# Patient Record
Sex: Female | Born: 1990 | Race: White | Hispanic: No | Marital: Single | Smoking: Never smoker
Health system: Southern US, Community
[De-identification: ages and names within clinical notes are randomized; demographics above are authoritative.]

## PROBLEM LIST (undated history)

## (undated) DIAGNOSIS — S82899A Other fracture of unspecified lower leg, initial encounter for closed fracture: Secondary | ICD-10-CM

## (undated) HISTORY — PX: INGUINAL HERNIA REPAIR: SHX194

## (undated) HISTORY — PX: WRIST SURGERY: SHX841

---
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
## 2009-02-27 ENCOUNTER — Emergency Department (HOSPITAL_COMMUNITY): Admission: EM | Admit: 2009-02-27 | Discharge: 2009-02-27 | Payer: Self-pay | Admitting: Family Medicine

## 2010-03-26 ENCOUNTER — Emergency Department (HOSPITAL_COMMUNITY)
Admission: EM | Admit: 2010-03-26 | Discharge: 2010-03-26 | Disposition: A | Discharge status: Home / Self Care | Payer: Self-pay | Attending: Emergency Medicine | Admitting: Emergency Medicine

## 2010-03-26 DIAGNOSIS — G5600 Carpal tunnel syndrome, unspecified upper limb: Secondary | ICD-10-CM | POA: Insufficient documentation

## 2010-05-20 NOTE — Discharge Summary (Signed)
NAMEBRITTLYN, Caroline Patterson                ACCOUNT NO.:  [id number]   MEDICAL RECORD NO.:  [id number]          PATIENT TYPE:  INP   LOCATION:  9114                          FACILITY:  [facility]   PHYSICIAN:  Malachi Pro. Ambrose Mantle, M.D. DATE OF BIRTH:  [birth year]   DATE OF ADMISSION:  03/28/2006  DATE OF DISCHARGE:  03/30/2006                               DISCHARGE SUMMARY   This is an 20 year old white single female, para 0 gravida 1, EDC April 06, 2006, by 10-week ultrasound, admitted for induction of labor.  Blood  group and type O negative with a negative antibody, nonreactive  serology, rubella immune, hepatitis B surface antigen negative, HIV  negative, GC and chlamydia negative.  One-hour Glucola 103.  Group B  strep negative.  Vaginal ultrasound on September 13, 2005, showed a  crown-rump length of 3.82 cm, 10 weeks 5 days, [facility] April 06, 2006.  Follow-up ultrasound on November 13, 2005:  Average gestational age [redacted]  weeks 0 days, [facility] April 02, 2006.  Prenatal care was uncomplicated.  The  cervix was 2 cm, 60%, vertex at a -2 station.  She was admitted for  induction.   PAST MEDICAL HISTORY:  No known allergies.  Operations:  Bilateral  inguinal hernia repair as a child.  Illnesses:  None.  Alcohol, tobacco  and drugs:  None.   FAMILY HISTORY:  Father with high blood pressure and alcohol problems.  Maternal grandmother with diabetes.  Mother with panic attacks and  anxiety.  Paternal grandmother with thrombophlebitis and paternal  grandfather with heart disease.   On admission her vital signs were normal.  Heart and lungs were normal.  The abdomen was soft.  Uterus was term size.  Fetal heart tones were  normal.  The cervix was 2 cm, 60%, vertex at a -2.   ADMITTING IMPRESSIONS:  Intrauterine pregnancy at 39 weeks.   The patient was placed on Pitocin.  By 1:41 p.m. the Pitocin was at 16  milliunits a minute.  Contractions every 2 minutes, somewhat painful.  The cervix was 3 cm, 70%, vertex  at a -2.  At 5:49 p.m. the Pitocin was  at 20 milliunits a minute.  Contractions were somewhat irregular.  Cervix was 3 cm, 70%, vertex at a -2, and artificial rupture of the  membranes produced clear fluid.  The nurse called the cervix 3-4 cm at  approximately 7:30 p.m.  She received an epidural.  At 8:30 p.m. the  cervix was 4-5 cm and at 9:32 p.m. was 5 cm, 100%, vertex at a -1.  Pitocin was at 22 milliunits a minute.  Contractions were every 2  minutes.  By 11:02 p.m. the Pitocin was at 28 milliunits a minute.  The  cervix was 8 cm, 100%, vertex at a 0 to 01 station.  Contractions every  2 minutes.  At 12:02 a.m. the cervix was fully dilated, vertex at a 0 to  +1, one station.  At 1:38 a.m. the cervix was 10 cm, vertex was close to  the perineum but not visible without perineal pressure, vertex felt OA.  After 3 hours of pushing, the patient was given 10 minutes of pushing,  then a left mediolateral episiotomy was made under local block and a  living female infant 8 pounds 2 ounces with Apgars of 8 at one and 9 at  five minutes was delivered OA.  Mild shoulder dystocia was managed by  wood screw and McRoberts maneuvers.  Placenta was intact.  The uterus  was normal.  Rectal was negative.  Left mediolateral episiotomy was  repaired with 2-0 and 3-0 Vicryl.  Blood loss was about 400 mL.  Postpartum the patient did well.  Initially she declined discharge on  the first postpartum day but subsequently recanted her decision and  wanted to be discharged, so she was discharged on the first postpartum  day.   Laboratory data showed initial hemoglobin of 12.7, hematocrit 37.6,  white count 8400, platelet count 161,000.  Follow-up hemoglobin 11.9.  The patient was not a candidate for RhoGAM because the baby was Rh  negative.   FINAL DIAGNOSES:  1. Intrauterine pregnancy at 39 weeks, delivered occiput anterior.  2. Prolonged second stage of labor.   OPERATION:  1. Spontaneous delivery,  occiput anterior.  2. Left mediolateral episiotomy and repair.   FINAL CONDITION:  Improved.   Instructions include our regular discharge instruction booklet.  The  patient is given a prescription for Percocet 5/325 mg 24 tablets, one  every 4-6 hours as needed for pain, and she is advised to return to the  office in 6 weeks for follow-up examination.      Malachi Pro. Ambrose Mantle, M.D.  Electronically Signed     TFH/MEDQ  D:  03/30/2006  T:  03/30/2006  Job:  [id number]

## 2012-04-03 ENCOUNTER — Ambulatory Visit: Payer: Self-pay | Admitting: Family Medicine

## 2013-11-05 ENCOUNTER — Other Ambulatory Visit: Payer: Self-pay | Admitting: Obstetrics and Gynecology

## 2013-11-05 DIAGNOSIS — N63 Unspecified lump in unspecified breast: Secondary | ICD-10-CM

## 2013-11-06 ENCOUNTER — Encounter (INDEPENDENT_AMBULATORY_CARE_PROVIDER_SITE_OTHER): Payer: Self-pay

## 2013-11-06 ENCOUNTER — Ambulatory Visit
Admission: RE | Admit: 2013-11-06 | Discharge: 2013-11-06 | Disposition: A | Discharge status: Home / Self Care | Payer: 59 | Source: Ambulatory Visit | Attending: Obstetrics and Gynecology | Admitting: Obstetrics and Gynecology

## 2013-11-06 DIAGNOSIS — N63 Unspecified lump in unspecified breast: Secondary | ICD-10-CM

## 2015-12-03 DIAGNOSIS — S82899A Other fracture of unspecified lower leg, initial encounter for closed fracture: Secondary | ICD-10-CM

## 2015-12-03 HISTORY — DX: Other fracture of unspecified lower leg, initial encounter for closed fracture: S82.899A

## 2015-12-23 ENCOUNTER — Encounter (HOSPITAL_BASED_OUTPATIENT_CLINIC_OR_DEPARTMENT_OTHER): Payer: Self-pay | Admitting: *Deleted

## 2015-12-28 ENCOUNTER — Ambulatory Visit: Payer: Self-pay | Admitting: Physician Assistant

## 2015-12-28 NOTE — H&P (Signed)
Caroline Patterson is an 25 y.o. female.   Chief Complaint: left ankle pain HPI: She was seen at urgent care.  She fell in the snow.  She has a lot of ankle pain central and medial.  She was told she had a fracture.  She does have an injury of the syndesmotic ligaments clinically, radiographically and on MRI.   Past Medical History:  Diagnosis Date  . Ankle fracture 12/2015   left    Past Surgical History:  Procedure Laterality Date  . INGUINAL HERNIA REPAIR    . WRIST SURGERY     tendon repair    No family history on file. Social History:  reports that she has never smoked. She has never used smokeless tobacco. She reports that she drinks alcohol. She reports that she does not use drugs.  Allergies: No Known Allergies   (Not in a hospital admission)  No results found for this or any previous visit (from the past 48 hour(s)). No results found.  Review of Systems  Musculoskeletal: Positive for falls and joint pain.  All other systems reviewed and are negative.   There were no vitals taken for this visit. Physical Exam  Constitutional: She is oriented to person, place, and time. She appears well-developed and well-nourished. No distress.  HENT:  Head: Normocephalic and atraumatic.  Nose: Nose normal.  Eyes: Conjunctivae and EOM are normal. Pupils are equal, round, and reactive to light.  Neck: Normal range of motion. Neck supple.  Cardiovascular: Normal rate and intact distal pulses.   Respiratory: Effort normal. No respiratory distress.  GI: Soft. She exhibits no distension. There is no tenderness.  Musculoskeletal:       Left ankle: She exhibits swelling and ecchymosis. Tenderness.  Neurological: She is alert and oriented to person, place, and time. No cranial nerve deficit.  Skin: Skin is warm and dry. No rash noted. No erythema.  Psychiatric: She has a normal mood and affect. Her behavior is normal.     Assessment/Plan Left ankle syndesmotic injury   I discussed  with her open reduction internal fixation of the syndesmosis. I also took the liberty of showing this to Dr. Carter Kitten and Dr. Fredonia Highland who both agree with my conclusion that this would require surgery. The risks and benefits were discussed in detail with the patient. She has Hydrocodone now; we will give her Percocet after surgery. Temporary handicap. We will see her back after the surgery.   Chriss Czar, PA-C 12/28/2015, 1:17 PM

## 2015-12-30 ENCOUNTER — Ambulatory Visit (HOSPITAL_BASED_OUTPATIENT_CLINIC_OR_DEPARTMENT_OTHER): Payer: 59 | Admitting: Anesthesiology

## 2015-12-30 ENCOUNTER — Encounter (HOSPITAL_BASED_OUTPATIENT_CLINIC_OR_DEPARTMENT_OTHER)
Admission: RE | Disposition: A | Discharge status: Home / Self Care | Payer: Self-pay | Source: Ambulatory Visit | Attending: Orthopedic Surgery

## 2015-12-30 ENCOUNTER — Encounter (HOSPITAL_BASED_OUTPATIENT_CLINIC_OR_DEPARTMENT_OTHER): Payer: Self-pay | Admitting: Anesthesiology

## 2015-12-30 ENCOUNTER — Ambulatory Visit (HOSPITAL_BASED_OUTPATIENT_CLINIC_OR_DEPARTMENT_OTHER)
Admission: RE | Admit: 2015-12-30 | Discharge: 2015-12-30 | Disposition: A | Discharge status: Home / Self Care | Payer: 59 | Source: Ambulatory Visit | Attending: Orthopedic Surgery | Admitting: Orthopedic Surgery

## 2015-12-30 DIAGNOSIS — W000XXA Fall on same level due to ice and snow, initial encounter: Secondary | ICD-10-CM | POA: Insufficient documentation

## 2015-12-30 DIAGNOSIS — S93432A Sprain of tibiofibular ligament of left ankle, initial encounter: Secondary | ICD-10-CM | POA: Insufficient documentation

## 2015-12-30 HISTORY — DX: Other fracture of unspecified lower leg, initial encounter for closed fracture: S82.899A

## 2015-12-30 HISTORY — PX: ORIF ANKLE FRACTURE: SHX5408

## 2015-12-30 SURGERY — OPEN REDUCTION INTERNAL FIXATION (ORIF) ANKLE FRACTURE
Anesthesia: General | Site: Ankle | Laterality: Left

## 2015-12-30 MED ORDER — MIDAZOLAM HCL 2 MG/2ML IJ SOLN
INTRAMUSCULAR | Status: AC
  Filled 2015-12-30: qty 2

## 2015-12-30 MED ORDER — ONDANSETRON HCL 4 MG/2ML IJ SOLN
INTRAMUSCULAR | Status: AC
  Filled 2015-12-30: qty 2

## 2015-12-30 MED ORDER — PROMETHAZINE HCL 25 MG/ML IJ SOLN: 6.2500 mg | INTRAMUSCULAR | Status: DC | PRN

## 2015-12-30 MED ORDER — MIDAZOLAM HCL 2 MG/2ML IJ SOLN
1.0000 mg | INTRAMUSCULAR | Status: DC | PRN
  Administered 2015-12-30: 2 mg via INTRAVENOUS

## 2015-12-30 MED ORDER — OXYCODONE-ACETAMINOPHEN 5-325 MG PO TABS: 1.0000 | ORAL_TABLET | ORAL | 0 refills | Status: DC | PRN

## 2015-12-30 MED ORDER — FENTANYL CITRATE (PF) 100 MCG/2ML IJ SOLN
INTRAMUSCULAR | Status: AC
  Filled 2015-12-30: qty 2

## 2015-12-30 MED ORDER — BUPIVACAINE-EPINEPHRINE (PF) 0.5% -1:200000 IJ SOLN
INTRAMUSCULAR | Status: DC | PRN
  Administered 2015-12-30: 20 mL via PERINEURAL
  Administered 2015-12-30: 30 mL via PERINEURAL

## 2015-12-30 MED ORDER — PROPOFOL 10 MG/ML IV BOLUS
INTRAVENOUS | Status: DC | PRN
  Administered 2015-12-30: 200 mg via INTRAVENOUS

## 2015-12-30 MED ORDER — FENTANYL CITRATE (PF) 100 MCG/2ML IJ SOLN
50.0000 ug | INTRAMUSCULAR | Status: DC | PRN
  Administered 2015-12-30: 100 ug via INTRAVENOUS

## 2015-12-30 MED ORDER — DEXAMETHASONE SODIUM PHOSPHATE 10 MG/ML IJ SOLN
INTRAMUSCULAR | Status: AC
  Filled 2015-12-30: qty 1

## 2015-12-30 MED ORDER — ONDANSETRON HCL 4 MG/2ML IJ SOLN
INTRAMUSCULAR | Status: DC | PRN
  Administered 2015-12-30: 4 mg via INTRAVENOUS

## 2015-12-30 MED ORDER — FENTANYL CITRATE (PF) 100 MCG/2ML IJ SOLN
INTRAMUSCULAR | Status: DC | PRN
  Administered 2015-12-30: 100 ug via INTRAVENOUS

## 2015-12-30 MED ORDER — LIDOCAINE HCL (CARDIAC) 20 MG/ML IV SOLN
INTRAVENOUS | Status: DC | PRN
  Administered 2015-12-30: 30 mg via INTRAVENOUS

## 2015-12-30 MED ORDER — HYDROMORPHONE HCL 1 MG/ML IJ SOLN: 0.2500 mg | INTRAMUSCULAR | Status: DC | PRN

## 2015-12-30 MED ORDER — MIDAZOLAM HCL 5 MG/5ML IJ SOLN
INTRAMUSCULAR | Status: DC | PRN
  Administered 2015-12-30: 2 mg via INTRAVENOUS

## 2015-12-30 MED ORDER — LACTATED RINGERS IV SOLN: INTRAVENOUS | Status: DC

## 2015-12-30 MED ORDER — LIDOCAINE 2% (20 MG/ML) 5 ML SYRINGE
INTRAMUSCULAR | Status: AC
  Filled 2015-12-30: qty 5

## 2015-12-30 MED ORDER — PROPOFOL 10 MG/ML IV BOLUS
INTRAVENOUS | Status: AC
  Filled 2015-12-30: qty 20

## 2015-12-30 MED ORDER — OXYCODONE HCL 5 MG/5ML PO SOLN: 5.0000 mg | Freq: Once | ORAL | Status: DC | PRN

## 2015-12-30 MED ORDER — SCOPOLAMINE 1 MG/3DAYS TD PT72: 1.0000 | MEDICATED_PATCH | Freq: Once | TRANSDERMAL | Status: DC | PRN

## 2015-12-30 MED ORDER — LACTATED RINGERS IV SOLN
INTRAVENOUS | Status: DC
  Administered 2015-12-30 (×2): via INTRAVENOUS

## 2015-12-30 MED ORDER — CHLORHEXIDINE GLUCONATE 4 % EX LIQD: 60.0000 mL | Freq: Once | CUTANEOUS | Status: DC

## 2015-12-30 MED ORDER — CEFAZOLIN SODIUM-DEXTROSE 2-4 GM/100ML-% IV SOLN
2.0000 g | INTRAVENOUS | Status: AC
  Administered 2015-12-30: 2 g via INTRAVENOUS

## 2015-12-30 MED ORDER — OXYCODONE HCL 5 MG PO TABS: 5.0000 mg | ORAL_TABLET | Freq: Once | ORAL | Status: DC | PRN

## 2015-12-30 MED ORDER — MEPERIDINE HCL 25 MG/ML IJ SOLN: 6.2500 mg | INTRAMUSCULAR | Status: DC | PRN

## 2015-12-30 MED ORDER — CEFAZOLIN SODIUM-DEXTROSE 2-4 GM/100ML-% IV SOLN
INTRAVENOUS | Status: AC
  Filled 2015-12-30: qty 100

## 2015-12-30 MED ORDER — DEXAMETHASONE SODIUM PHOSPHATE 4 MG/ML IJ SOLN
INTRAMUSCULAR | Status: DC | PRN
  Administered 2015-12-30: 10 mg via INTRAVENOUS

## 2015-12-30 SURGICAL SUPPLY — 70 items
BANDAGE ACE 4X5 VEL STRL LF (GAUZE/BANDAGES/DRESSINGS) ×3 IMPLANT
BANDAGE ACE 6X5 VEL STRL LF (GAUZE/BANDAGES/DRESSINGS) ×3 IMPLANT
BANDAGE ESMARK 6X9 LF (GAUZE/BANDAGES/DRESSINGS) ×1 IMPLANT
BLADE SURG 15 STRL LF DISP TIS (BLADE) ×2 IMPLANT
BLADE SURG 15 STRL SS (BLADE) ×4
BNDG ESMARK 4X9 LF (GAUZE/BANDAGES/DRESSINGS) IMPLANT
BNDG ESMARK 6X9 LF (GAUZE/BANDAGES/DRESSINGS) ×3
BNDG GAUZE ELAST 4 BULKY (GAUZE/BANDAGES/DRESSINGS) ×3 IMPLANT
CANISTER SUCT 1200ML W/VALVE (MISCELLANEOUS) IMPLANT
COVER BACK TABLE 60X90IN (DRAPES) ×3 IMPLANT
CUFF TOURNIQUET SINGLE 44IN (TOURNIQUET CUFF) ×3 IMPLANT
DECANTER SPIKE VIAL GLASS SM (MISCELLANEOUS) IMPLANT
DRAPE EXTREMITY T 121X128X90 (DRAPE) ×3 IMPLANT
DRAPE OEC MINIVIEW 54X84 (DRAPES) ×3 IMPLANT
DRAPE U-SHAPE 47X51 STRL (DRAPES) ×6 IMPLANT
DRSG EMULSION OIL 3X3 NADH (GAUZE/BANDAGES/DRESSINGS) ×3 IMPLANT
DRSG PAD ABDOMINAL 8X10 ST (GAUZE/BANDAGES/DRESSINGS) ×3 IMPLANT
DURAPREP 26ML APPLICATOR (WOUND CARE) ×3 IMPLANT
ELECT REM PT RETURN 9FT ADLT (ELECTROSURGICAL) ×3
ELECTRODE REM PT RTRN 9FT ADLT (ELECTROSURGICAL) ×1 IMPLANT
GAUZE SPONGE 4X4 12PLY STRL (GAUZE/BANDAGES/DRESSINGS) ×3 IMPLANT
GLOVE BIO SURGEON STRL SZ 6.5 (GLOVE) ×4 IMPLANT
GLOVE BIO SURGEON STRL SZ7.5 (GLOVE) ×3 IMPLANT
GLOVE BIO SURGEONS STRL SZ 6.5 (GLOVE) ×2
GLOVE BIOGEL PI IND STRL 7.0 (GLOVE) ×1 IMPLANT
GLOVE BIOGEL PI IND STRL 8 (GLOVE) ×2 IMPLANT
GLOVE BIOGEL PI INDICATOR 7.0 (GLOVE) ×2
GLOVE BIOGEL PI INDICATOR 8 (GLOVE) ×4
GLOVE SURG ORTHO 8.0 STRL STRW (GLOVE) ×3 IMPLANT
GOWN STRL REUS W/ TWL LRG LVL3 (GOWN DISPOSABLE) ×1 IMPLANT
GOWN STRL REUS W/ TWL XL LVL3 (GOWN DISPOSABLE) ×1 IMPLANT
GOWN STRL REUS W/TWL LRG LVL3 (GOWN DISPOSABLE) ×2
GOWN STRL REUS W/TWL XL LVL3 (GOWN DISPOSABLE) ×2
IMMOBILIZER KNEE 22 UNIV (SOFTGOODS) IMPLANT
IMMOBILIZER KNEE 24 THIGH 36 (MISCELLANEOUS) IMPLANT
IMMOBILIZER KNEE 24 UNIV (MISCELLANEOUS)
NS IRRIG 1000ML POUR BTL (IV SOLUTION) ×3 IMPLANT
PACK ARTHROSCOPY DSU (CUSTOM PROCEDURE TRAY) ×3 IMPLANT
PACK BASIN DAY SURGERY FS (CUSTOM PROCEDURE TRAY) ×3 IMPLANT
PAD CAST 4YDX4 CTTN HI CHSV (CAST SUPPLIES) ×1 IMPLANT
PADDING CAST ABS 3INX4YD NS (CAST SUPPLIES)
PADDING CAST ABS 4INX4YD NS (CAST SUPPLIES) ×2
PADDING CAST ABS COTTON 3X4 (CAST SUPPLIES) IMPLANT
PADDING CAST ABS COTTON 4X4 ST (CAST SUPPLIES) ×1 IMPLANT
PADDING CAST COTTON 4X4 STRL (CAST SUPPLIES) ×2
PADDING CAST COTTON 6X4 STRL (CAST SUPPLIES) ×3 IMPLANT
PENCIL BUTTON HOLSTER BLD 10FT (ELECTRODE) ×3 IMPLANT
PLATE DUAL 2HOLE SYNDESMOSIS (Plate) ×3 IMPLANT
SHEET MEDIUM DRAPE 40X70 STRL (DRAPES) IMPLANT
SPLINT FAST PLASTER 5X30 (CAST SUPPLIES) ×50
SPLINT PLASTER CAST FAST 5X30 (CAST SUPPLIES) ×25 IMPLANT
SPONGE LAP 4X18 X RAY DECT (DISPOSABLE) ×3 IMPLANT
STAPLER VISISTAT 35W (STAPLE) IMPLANT
STOCKINETTE 6  STRL (DRAPES) ×2
STOCKINETTE 6 STRL (DRAPES) ×1 IMPLANT
SUCTION FRAZIER HANDLE 10FR (MISCELLANEOUS)
SUCTION TUBE FRAZIER 10FR DISP (MISCELLANEOUS) IMPLANT
SUT ETHILON 3 0 PS 1 (SUTURE) ×3 IMPLANT
SUT ETHILON 4 0 PS 2 18 (SUTURE) IMPLANT
SUT VIC AB 0 CT1 27 (SUTURE)
SUT VIC AB 0 CT1 27XBRD ANBCTR (SUTURE) IMPLANT
SUT VIC AB 2-0 CT1 27 (SUTURE)
SUT VIC AB 2-0 CT1 TAPERPNT 27 (SUTURE) IMPLANT
SUT VIC AB 2-0 PS2 27 (SUTURE) ×3 IMPLANT
SUT VIC AB 3-0 SH 27 (SUTURE) ×2
SUT VIC AB 3-0 SH 27X BRD (SUTURE) ×1 IMPLANT
SYR BULB 3OZ (MISCELLANEOUS) ×3 IMPLANT
TOWEL OR 17X24 6PK STRL BLUE (TOWEL DISPOSABLE) ×3 IMPLANT
TOWEL OR NON WOVEN STRL DISP B (DISPOSABLE) ×3 IMPLANT
UNDERPAD 30X30 (UNDERPADS AND DIAPERS) ×3 IMPLANT

## 2015-12-30 NOTE — Interval H&P Note (Signed)
History and Physical Interval Note:  12/30/2015 11:25 AM  Caroline Patterson  has presented today for surgery, with the diagnosis of Other fracture of unspecified lower leg, initial encounter for closed fracture  S82.899A  The various methods of treatment have been discussed with the patient and family. After consideration of risks, benefits and other options for treatment, the patient has consented to  Procedure(s): OPEN REDUCTION INTERNAL FIXATION (ORIF) LEFT ANKLE SYNDESMOSIS (Left) as a surgical intervention .  The patient's history has been reviewed, patient examined, no change in status, stable for surgery.  I have reviewed the patient's chart and labs.  Questions were answered to the patient's satisfaction.     Ladiamond Gallina JR,W D

## 2015-12-30 NOTE — Transfer of Care (Signed)
Immediate Anesthesia Transfer of Care Note  Patient: Caroline Patterson  Procedure(s) Performed: Procedure(s): OPEN REDUCTION INTERNAL FIXATION (ORIF) LEFT ANKLE SYNDESMOSIS (Left)  Patient Location: PACU  Anesthesia Type:GA combined with regional for post-op pain  Level of Consciousness: awake and patient cooperative  Airway & Oxygen Therapy: Patient Spontanous Breathing  Post-op Assessment: Report given to RN and Post -op Vital signs reviewed and stable  Post vital signs: Reviewed and stable  Last Vitals:  Vitals:   12/30/15 1200 12/30/15 1205  BP: 119/69   Pulse: 89 90  Resp: 13 16  Temp:      Last Pain:  Vitals:   12/30/15 1118  TempSrc: Oral  PainSc:          Complications: No apparent anesthesia complications

## 2015-12-30 NOTE — Progress Notes (Signed)
Assisted Dr. Hollis with left, ultrasound guided, popliteal/saphenous block. Side rails up, monitors on throughout procedure. See vital signs in flow sheet. Tolerated Procedure well. 

## 2015-12-30 NOTE — H&P (View-Only) (Signed)
Caroline Patterson is an 25 y.o. female.   Chief Complaint: left ankle pain HPI: She was seen at urgent care.  She fell in the snow.  She has a lot of ankle pain central and medial.  She was told she had a fracture.  She does have an injury of the syndesmotic ligaments clinically, radiographically and on MRI.   Past Medical History:  Diagnosis Date  . Ankle fracture 12/2015   left    Past Surgical History:  Procedure Laterality Date  . INGUINAL HERNIA REPAIR    . WRIST SURGERY     tendon repair    No family history on file. Social History:  reports that she has never smoked. She has never used smokeless tobacco. She reports that she drinks alcohol. She reports that she does not use drugs.  Allergies: No Known Allergies   (Not in a hospital admission)  No results found for this or any previous visit (from the past 48 hour(s)). No results found.  Review of Systems  Musculoskeletal: Positive for falls and joint pain.  All other systems reviewed and are negative.   There were no vitals taken for this visit. Physical Exam  Constitutional: She is oriented to person, place, and time. She appears well-developed and well-nourished. No distress.  HENT:  Head: Normocephalic and atraumatic.  Nose: Nose normal.  Eyes: Conjunctivae and EOM are normal. Pupils are equal, round, and reactive to light.  Neck: Normal range of motion. Neck supple.  Cardiovascular: Normal rate and intact distal pulses.   Respiratory: Effort normal. No respiratory distress.  GI: Soft. She exhibits no distension. There is no tenderness.  Musculoskeletal:       Left ankle: She exhibits swelling and ecchymosis. Tenderness.  Neurological: She is alert and oriented to person, place, and time. No cranial nerve deficit.  Skin: Skin is warm and dry. No rash noted. No erythema.  Psychiatric: She has a normal mood and affect. Her behavior is normal.     Assessment/Plan Left ankle syndesmotic injury   I discussed  with her open reduction internal fixation of the syndesmosis. I also took the liberty of showing this to Dr. Carter Kitten and Dr. Fredonia Highland who both agree with my conclusion that this would require surgery. The risks and benefits were discussed in detail with the patient. She has Hydrocodone now; we will give her Percocet after surgery. Temporary handicap. We will see her back after the surgery.   Chriss Czar, PA-C 12/28/2015, 1:17 PM

## 2015-12-30 NOTE — Anesthesia Postprocedure Evaluation (Signed)
Anesthesia Post Note  Patient: BABARA STEEB  Procedure(s) Performed: Procedure(s) (LRB): OPEN REDUCTION INTERNAL FIXATION (ORIF) LEFT ANKLE SYNDESMOSIS (Left)  Patient location during evaluation: PACU Anesthesia Type: General and Regional Level of consciousness: awake and alert Pain management: pain level controlled Vital Signs Assessment: post-procedure vital signs reviewed and stable Respiratory status: spontaneous breathing, nonlabored ventilation, respiratory function stable and patient connected to nasal cannula oxygen Cardiovascular status: blood pressure returned to baseline and stable Postop Assessment: no signs of nausea or vomiting Anesthetic complications: no       Last Vitals:  Vitals:   12/30/15 1345 12/30/15 1400  BP: 105/64 113/76  Pulse: 89 88  Resp: 18 18  Temp:      Last Pain:  Vitals:   12/30/15 1430  TempSrc:   PainSc: 0-No pain                 Effie Berkshire

## 2015-12-30 NOTE — Anesthesia Procedure Notes (Signed)
Procedure Name: LMA Insertion Date/Time: 12/30/2015 12:16 PM Performed by: Toula Moos L Pre-anesthesia Checklist: Patient identified, Emergency Drugs available, Suction available, Patient being monitored and Timeout performed Patient Re-evaluated:Patient Re-evaluated prior to inductionOxygen Delivery Method: Circle system utilized Preoxygenation: Pre-oxygenation with 100% oxygen Intubation Type: IV induction Ventilation: Mask ventilation without difficulty LMA: LMA inserted LMA Size: 4.0 Number of attempts: 1 Airway Equipment and Method: Bite block Placement Confirmation: positive ETCO2 Tube secured with: Tape Dental Injury: Teeth and Oropharynx as per pre-operative assessment

## 2015-12-30 NOTE — Anesthesia Procedure Notes (Signed)
Anesthesia Regional Block:  Popliteal block  Pre-Anesthetic Checklist: ,, timeout performed, Correct Patient, Correct Site, Correct Laterality, Correct Procedure, Correct Position, site marked, Risks and benefits discussed,  Surgical consent,  Pre-op evaluation,  At surgeon's request and post-op pain management  Laterality: Left  Prep: chloraprep       Needles:  Injection technique: Single-shot  Needle Type: Echogenic Needle     Needle Length: 9cm 9 cm Needle Gauge: 21 and 21 G    Additional Needles:  Procedures: ultrasound guided (picture in chart) Popliteal block Narrative:  Start time: 12/30/2015 12:00 PM End time: 12/30/2015 12:05 PM Injection made incrementally with aspirations every 5 mL.  Performed by: Personally  Anesthesiologist: Suella Broad D  Additional Notes: Pt tolerated well.

## 2015-12-30 NOTE — Discharge Instructions (Signed)
Diet: As you were doing prior to hospitalization   Activity: Increase activity slowly as tolerated  No lifting or driving for 48 hours  Shower: May shower but need to keep splint clean and dry.  Dressing: you are to leave splint in place until office follow up.    Weight Bearing: nonweight bearing left leg  Use a walker or  Crutches as instructed.   To prevent constipation: you may use a stool softener such as -  Colace ( over the counter) 100 mg by mouth twice a day  Drink plenty of fluids ( prune juice may be helpful) and high fiber foods  Miralax ( over the counter) for constipation as needed.   Precautions: If you experience chest pain or shortness of breath - call 911 immediately For transfer to the hospital emergency department!!  If you develop a fever greater that 101 F, purulent drainage from wound, increased redness or drainage from wound, or calf pain -- Call the office   Follow- Up Appointment: Please call for an appointment to be seen in 2 weeks  [place] - [phone]  Post Anesthesia Home Care Instructions  Activity: Get plenty of rest for the remainder of the day. A responsible adult should stay with you for 24 hours following the procedure.  For the next 24 hours, DO NOT: -Drive a car -Paediatric nurse -Drink alcoholic beverages -Take any medication unless instructed by your physician -Make any legal decisions or sign important papers.  Meals: Start with liquid foods such as gelatin or soup. Progress to regular foods as tolerated. Avoid greasy, spicy, heavy foods. If nausea and/or vomiting occur, drink only clear liquids until the nausea and/or vomiting subsides. Call your physician if vomiting continues.  Special Instructions/Symptoms: Your throat may feel dry or sore from the anesthesia or the breathing tube placed in your throat during surgery. If this causes discomfort, gargle with warm salt water. The discomfort should disappear within 24 hours.  If  you had a scopolamine patch placed behind your ear for the management of post- operative nausea and/or vomiting:  1. The medication in the patch is effective for 72 hours, after which it should be removed.  Wrap patch in a tissue and discard in the trash. Wash hands thoroughly with soap and water. 2. You may remove the patch earlier than 72 hours if you experience unpleasant side effects which may include dry mouth, dizziness or visual disturbances. 3. Avoid touching the patch. Wash your hands with soap and water after contact with the patch.   Regional Anesthesia Blocks  1. Numbness or the inability to move the "blocked" extremity may last from 3-48 hours after placement. The length of time depends on the medication injected and your individual response to the medication. If the numbness is not going away after 48 hours, call your surgeon.  2. The extremity that is blocked will need to be protected until the numbness is gone and the  Strength has returned. Because you cannot feel it, you will need to take extra care to avoid injury. Because it may be weak, you may have difficulty moving it or using it. You may not know what position it is in without looking at it while the block is in effect.  3. For blocks in the legs and feet, returning to weight bearing and walking needs to be done carefully. You will need to wait until the numbness is entirely gone and the strength has returned. You should be able to move your leg  and foot normally before you try and bear weight or walk. You will need someone to be with you when you first try to ensure you do not fall and possibly risk injury.  4. Bruising and tenderness at the needle site are common side effects and will resolve in a few days.  5. Persistent numbness or new problems with movement should be communicated to the surgeon or the [place] [phone] [place] [phone]).

## 2015-12-30 NOTE — Anesthesia Preprocedure Evaluation (Addendum)
Anesthesia Evaluation  Patient identified by MRN, date of birth, ID band Patient awake    Reviewed: Allergy & Precautions, NPO status , Patient's Chart, lab work & pertinent test results  Airway Mallampati: I  TM Distance: >3 FB Neck ROM: Full    Dental  (+) Teeth Intact, Dental Advisory Given   Pulmonary neg pulmonary ROS,    breath sounds clear to auscultation       Cardiovascular negative cardio ROS   Rhythm:Regular Rate:Normal     Neuro/Psych negative neurological ROS  negative psych ROS   GI/Hepatic negative GI ROS, Neg liver ROS,   Endo/Other  negative endocrine ROS  Renal/GU negative Renal ROS  negative genitourinary   Musculoskeletal negative musculoskeletal ROS (+)   Abdominal (+) + obese,   Peds negative pediatric ROS (+)  Hematology negative hematology ROS (+)   Anesthesia Other Findings   Reproductive/Obstetrics negative OB ROS                            Anesthesia Physical Anesthesia Plan  ASA: II  Anesthesia Plan: General   Post-op Pain Management: GA combined w/ Regional for post-op pain   Induction: Intravenous  Airway Management Planned: LMA  Additional Equipment:   Intra-op Plan:   Post-operative Plan: Extubation in OR  Informed Consent: I have reviewed the patients History and Physical, chart, labs and discussed the procedure including the risks, benefits and alternatives for the proposed anesthesia with the patient or authorized representative who has indicated his/her understanding and acceptance.   Dental advisory given  Plan Discussed with: CRNA  Anesthesia Plan Comments:        Anesthesia Quick Evaluation

## 2015-12-30 NOTE — Brief Op Note (Signed)
12/30/2015  1:24 PM  PATIENT:  Caroline Patterson  25 y.o. female  PRE-OPERATIVE DIAGNOSIS:  Other fracture of unspecified lower leg, initial encounter for closed fracture  S82.899A  POST-OPERATIVE DIAGNOSIS:  Other fracture of unspecified lower leg, initial encounter for closed fracture  S82.899A  PROCEDURE:  Procedure(s): OPEN REDUCTION INTERNAL FIXATION (ORIF) LEFT ANKLE SYNDESMOSIS (Left)  SURGEON:  Surgeon(s) and Role:    * Earlie Server, MD - Primary  PHYSICIAN ASSISTANT: Chriss Czar, PA-C  ASSISTANTS:   ANESTHESIA:   regional and general  EBL:  Total I/O In: 1500 [I.V.:1500] Out: -   BLOOD ADMINISTERED:none  DRAINS: none   LOCAL MEDICATIONS USED:  NONE  SPECIMEN:  No Specimen  DISPOSITION OF SPECIMEN:  N/A  COUNTS:  YES  TOURNIQUET:   Total Tourniquet Time Documented: Thigh (Left) - 44 minutes Total: Thigh (Left) - 44 minutes   DICTATION: .Other Dictation: Dictation Number unknown  PLAN OF CARE: Discharge to home after PACU  PATIENT DISPOSITION:  PACU - hemodynamically stable.   Delay start of Pharmacological VTE agent (>24hrs) due to surgical blood loss or risk of bleeding: not applicable

## 2015-12-30 NOTE — Anesthesia Procedure Notes (Signed)
Anesthesia Regional Block:  Adductor canal block  Pre-Anesthetic Checklist: ,, timeout performed, Correct Patient, Correct Site, Correct Laterality, Correct Procedure, Correct Position, site marked, Risks and benefits discussed,  Surgical consent,  Pre-op evaluation,  At surgeon's request and post-op pain management  Laterality: Left  Prep: chloraprep       Needles:  Injection technique: Single-shot  Needle Type: Echogenic Needle     Needle Length: 9cm 9 cm Needle Gauge: 21 and 21 G    Additional Needles:  Procedures: ultrasound guided (picture in chart) Adductor canal block Narrative:  Start time: 12/30/2015 12:05 PM End time: 12/30/2015 12:10 PM Injection made incrementally with aspirations every 5 mL.  Performed by: Personally  Anesthesiologist: Suella Broad D  Additional Notes: Pt tolerated well.

## 2015-12-31 ENCOUNTER — Encounter (HOSPITAL_BASED_OUTPATIENT_CLINIC_OR_DEPARTMENT_OTHER): Payer: Self-pay | Admitting: Orthopedic Surgery

## 2015-12-31 NOTE — Op Note (Signed)
NAME:  Caroline Patterson, Caroline Patterson                     ACCOUNT NO.:  MEDICAL RECORD NO.:  [id number]  LOCATION:                                 FACILITY:  PHYSICIAN:  Lockie Pares, M.D.         DATE OF BIRTH:  DATE OF PROCEDURE:  12/30/2015 DATE OF DISCHARGE:                              OPERATIVE REPORT   PREOPERATIVE DIAGNOSIS:  Left ankle syndesmotic ankle ligament disruption.  POSTOPERATIVE DIAGNOSIS:  Left ankle syndesmotic ankle ligament disruption.  OPERATION:  Open reduction and internal fixation, left syndesmotic ligaments (with Arthrex dual plate with 2 TightRope fixation).  SURGEON:  Lockie Pares, M.D.  ASSISTANT:  Chriss Czar, PA-C.  TOURNIQUET TIME:  50 minutes.  Also, I think an ankle block was done.  DESCRIPTION OF PROCEDURE: Supine positioning, exsanguination of the leg, inflation of tourniquet to 350.  We made an incision about 2 cm above the ankle mortise.  We placed provisionally the dual 2-hole plate with a BB guide pin.  We then drilled a guide pin about 1.5 cm above the joint line for the most distal of the syndesmotic fixation devices.  We then placed the Arthrex TightRope through and through after drilling over the pin with a cannulated drill.  We tightened this, which closed the medial widening of the deltoid as well as the widening of the syndesmotic area relative to the tib-fib joint.  Similar procedure was carried out for the second TightRope as well.  The small incision on the lateral side of the ankle was closed with Vicryl and Monocryl.  Lightly compressive sterile dressing and posterior splint applied.  Taken to the recovery room in a stable condition.  Tourniquet was released after application of the dressing.     Lockie Pares, M.D.     WDC/MEDQ  D:  12/30/2015  T:  12/30/2015  Job:  [id number]

## 2017-01-12 LAB — HIV ANTIBODY (ROUTINE TESTING W REFLEX): HIV 1&2 Ab, 4th Generation: NEGATIVE

## 2017-01-12 LAB — HM PAP SMEAR: HM Pap smear: NEGATIVE

## 2018-11-06 ENCOUNTER — Other Ambulatory Visit: Payer: Self-pay

## 2018-11-06 DIAGNOSIS — Z20822 Contact with and (suspected) exposure to covid-19: Secondary | ICD-10-CM

## 2018-11-07 LAB — NOVEL CORONAVIRUS, NAA: SARS-CoV-2, NAA: NOT DETECTED

## 2019-07-28 ENCOUNTER — Telehealth: Payer: Self-pay | Admitting: Nurse Practitioner

## 2019-07-28 NOTE — Telephone Encounter (Signed)
Patient called in with concerns she may have an infection on her right breast, under her areola, as she noticed a lump about 2 weeks ago. Patient is concerned as she did get her nipples pierced not too long ago, and the right one did get infected. Patient tried her best to get rid of it and now has concerns as the lump has developed. Patient scheduled for visit 7/28 and new patient appointment 8/24.

## 2019-07-30 ENCOUNTER — Ambulatory Visit: Payer: 59 | Admitting: Family Medicine

## 2019-07-31 ENCOUNTER — Encounter: Payer: Self-pay | Admitting: Family Medicine

## 2019-07-31 ENCOUNTER — Ambulatory Visit (INDEPENDENT_AMBULATORY_CARE_PROVIDER_SITE_OTHER): Payer: Managed Care, Other (non HMO) | Admitting: Family Medicine

## 2019-07-31 ENCOUNTER — Other Ambulatory Visit: Payer: Self-pay

## 2019-07-31 ENCOUNTER — Other Ambulatory Visit: Payer: Self-pay | Admitting: Family Medicine

## 2019-07-31 ENCOUNTER — Ambulatory Visit: Payer: Medicaid Other | Admitting: Family Medicine

## 2019-07-31 VITALS — BP 100/74 | HR 72 | Temp 98.9°F | Ht 62.0 in | Wt 194.0 lb

## 2019-07-31 DIAGNOSIS — N6320 Unspecified lump in the left breast, unspecified quadrant: Secondary | ICD-10-CM | POA: Insufficient documentation

## 2019-07-31 DIAGNOSIS — N6310 Unspecified lump in the right breast, unspecified quadrant: Secondary | ICD-10-CM

## 2019-07-31 NOTE — Progress Notes (Signed)
Chief Complaint  Patient presents with  . Breast Mass    Right-Recently got nipples pierced end of April-got infected    History of Present Illness: HPI    29 year old female pt of [facility] presents with new onset breast mass on right.  Hx of nipple piercing 04/2019 with infection in  1 month ago.  She had allergic reaction to the metal as well.  She has noted  a knot under right areola in last several weeks. Change was not there prior. Area feels pea size. Mild tenderness off and on. No cahnge in size, no redness.  No nipple discharge, no discharge from the area.  Hx of breast cysts in past.   She has not treated with anything.    This visit occurred during the SARS-CoV-2 public health emergency.  Safety protocols were in place, including screening questions prior to the visit, additional usage of staff PPE, and extensive cleaning of exam room while observing appropriate contact time as indicated for disinfecting solutions.   COVID 19 screen:  No recent travel or known exposure to COVID19 The patient denies respiratory symptoms of COVID 19 at this time. The importance of social distancing was discussed today.     Review of Systems  Constitutional: Negative for chills and fever.  HENT: Negative for congestion and ear pain.   Eyes: Negative for pain and redness.  Respiratory: Negative for cough and shortness of breath.   Cardiovascular: Negative for chest pain, palpitations and leg swelling.  Gastrointestinal: Negative for abdominal pain, blood in stool, constipation, diarrhea, nausea and vomiting.  Genitourinary: Negative for dysuria.  Musculoskeletal: Negative for falls and myalgias.  Skin: Negative for rash.  Neurological: Negative for dizziness.  Psychiatric/Behavioral: Negative for depression. The patient is not nervous/anxious.       Past Medical History:  Diagnosis Date  . Ankle fracture 12/2015   left    reports that she has never smoked. She has never  used smokeless tobacco. She reports current alcohol use. She reports that she does not use drugs.   Current Outpatient Medications:  .  etonogestrel (IMPLANON) 68 MG IMPL implant, 1 each by Subdermal route once., Disp: , Rfl:    Observations/Objective: Blood pressure 100/74, pulse 72, temperature 98.9 F (37.2 C), temperature source Temporal, height 5\' 2"  (1.575 m), weight 194 lb (88 kg), SpO2 98 %.  Physical Exam Exam conducted with a chaperone present.  Constitutional:      General: She is not in acute distress.    Appearance: Normal appearance. She is well-developed. She is not ill-appearing or toxic-appearing.  HENT:     Head: Normocephalic.     Right Ear: Hearing, tympanic membrane, ear canal and external ear normal. Tympanic membrane is not erythematous, retracted or bulging.     Left Ear: Hearing, tympanic membrane, ear canal and external ear normal. Tympanic membrane is not erythematous, retracted or bulging.     Nose: No mucosal edema or rhinorrhea.     Right Sinus: No maxillary sinus tenderness or frontal sinus tenderness.     Left Sinus: No maxillary sinus tenderness or frontal sinus tenderness.     Mouth/Throat:     Pharynx: Uvula midline.  Eyes:     General: Lids are normal. Lids are everted, no foreign bodies appreciated.     Conjunctiva/sclera: Conjunctivae normal.     Pupils: Pupils are equal, round, and reactive to light.  Neck:     Thyroid: No thyroid mass or thyromegaly.  Vascular: No carotid bruit.     Trachea: Trachea normal.  Cardiovascular:     Rate and Rhythm: Normal rate and regular rhythm.     Pulses: Normal pulses.     Heart sounds: Normal heart sounds, S1 normal and S2 normal. No murmur heard.  No friction rub. No gallop.   Pulmonary:     Effort: Pulmonary effort is normal. No tachypnea or respiratory distress.     Breath sounds: Normal breath sounds. No decreased breath sounds, wheezing, rhonchi or rales.  Chest:     Breasts:        Right: Mass  and tenderness present.        Left: Normal.     Comments: rigth breast lup noted at 9 ocklock on aereola Abdominal:     General: Bowel sounds are normal.     Palpations: Abdomen is soft.     Tenderness: There is no abdominal tenderness.  Musculoskeletal:     Cervical back: Normal range of motion and neck supple.  Lymphadenopathy:     Upper Body:     Right upper body: No supraclavicular, axillary or pectoral adenopathy.     Left upper body: No supraclavicular, axillary or pectoral adenopathy.  Skin:    General: Skin is warm and dry.     Findings: No rash.  Neurological:     Mental Status: She is alert.  Psychiatric:        Mood and Affect: Mood is not anxious or depressed.        Speech: Speech normal.        Behavior: Behavior normal. Behavior is cooperative.        Thought Content: Thought content normal.        Judgment: Judgment normal.      Assessment and Plan   Breast mass, right Likely fatty necrosis vs cyst.. eval with [place] +/_ mammogram given age.     Eliezer Lofts, MD

## 2019-07-31 NOTE — Telephone Encounter (Signed)
Pt seeing Dr. Diona Browner today.

## 2019-07-31 NOTE — Patient Instructions (Signed)
We will call to set up the [place] for you.

## 2019-07-31 NOTE — Assessment & Plan Note (Signed)
Likely fatty necrosis vs cyst.. eval with [place] +/_ mammogram given age.

## 2019-08-19 ENCOUNTER — Other Ambulatory Visit: Payer: Self-pay | Admitting: Family Medicine

## 2019-08-19 ENCOUNTER — Ambulatory Visit
Admission: RE | Admit: 2019-08-19 | Discharge: 2019-08-19 | Disposition: A | Discharge status: Home / Self Care | Payer: Managed Care, Other (non HMO) | Source: Ambulatory Visit | Attending: Family Medicine | Admitting: Family Medicine

## 2019-08-19 ENCOUNTER — Other Ambulatory Visit: Payer: Self-pay

## 2019-08-19 DIAGNOSIS — N6320 Unspecified lump in the left breast, unspecified quadrant: Secondary | ICD-10-CM

## 2019-08-19 DIAGNOSIS — N6310 Unspecified lump in the right breast, unspecified quadrant: Secondary | ICD-10-CM

## 2019-08-26 ENCOUNTER — Encounter: Payer: Self-pay | Admitting: Family Medicine

## 2019-08-26 ENCOUNTER — Other Ambulatory Visit: Payer: Self-pay

## 2019-08-26 ENCOUNTER — Ambulatory Visit (INDEPENDENT_AMBULATORY_CARE_PROVIDER_SITE_OTHER): Payer: Managed Care, Other (non HMO) | Admitting: Family Medicine

## 2019-08-26 DIAGNOSIS — G43009 Migraine without aura, not intractable, without status migrainosus: Secondary | ICD-10-CM | POA: Diagnosis not present

## 2019-08-26 DIAGNOSIS — G44229 Chronic tension-type headache, not intractable: Secondary | ICD-10-CM | POA: Insufficient documentation

## 2019-08-26 DIAGNOSIS — F411 Generalized anxiety disorder: Secondary | ICD-10-CM

## 2019-08-26 DIAGNOSIS — F410 Panic disorder [episodic paroxysmal anxiety] without agoraphobia: Secondary | ICD-10-CM

## 2019-08-26 MED ORDER — FLUOXETINE HCL 20 MG PO TABS: ORAL_TABLET | ORAL | 3 refills | Status: DC

## 2019-08-26 NOTE — Patient Instructions (Addendum)
How to help anxiety - without medication.   1) Regular Exercise - walking, jogging, cycling, dancing, strength training --> Yoga has been shown in research to reduce depression and anxiety -- with even just one hour long session per week  2)  Begin a Mindfulness/Meditation practice -- this can take a little as 3 minutes and is helpful for all kinds of mood issues -- You can find resources in books -- Or you can download apps like  ---- Headspace App (which currently has free content called "Weathering the Storm") ---- Calm (which has a few free options)  ---- Insignt Timer ---- Stop, Breathe & Think  # With each of these Apps - you should decline the "start free trial" offer and as you search through the App should be able to access some of their free content. You can also chose to pay for the content if you find one that works well for you.   # Many of them also offer sleep specific content which may help with insomnia  3) Healthy Diet -- Avoid or decrease Caffeine -- Avoid or decrease Alcohol -- Drink plenty of water, have a balanced diet -- Avoid cigarettes and marijuana (as well as other recreational drugs)  4) Consider contacting a professional therapist  -- [place] is one option. Call [phone] -- Or you can check out www.psychologytoday.com -- you can read bios of therapists and see if they accept insurance -- Check with your insurance to see if you have coverage and who may take your insurance  Headaches - Riboflavin 400 mg daily (may impact urine color) - Try to reduce caffeine by 1 serving per day - consider keeping a diary to look for food triggers (nitrites/chocolate) - try to look up some posture neck/shoulder exercises > as neck pain can cause headaches - try a mouth guard

## 2019-08-26 NOTE — Assessment & Plan Note (Signed)
Pt reports possible hx of bipolar diagnosis but denies manic episodes. Discussed risk of this medication worsening and she will monitor. Start prozac. 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 Return 6 weeks. Hand out for mindfulness as therapy too expensive.

## 2019-08-26 NOTE — Progress Notes (Signed)
Subjective:     Caroline Patterson is a 29 y.o. female presenting for Establish Care     HPI  #Anxiety - worrying more days - feels this impact restful sleep - is taking naps - hard time sitting still  - prior treatment: saw psych and was prescribed xanax and hx of addiction  - was diagnosed with bipolar disorder - starting to snap at patients - was burned out at work - was doing therapy twice monthly until insurance changes and no too expensive    #Headaches - gets HA in two places ---- b/l temples and across the front - with pressure pain - getting the frontal HA daily - will treat with tylenol w/ some improvement - taking medication 3-4 times per week - also uses naproxen - but rare use  Water: 24 oz bottle - x 3  Caffeine: 4 servings of coffee and occasional soda or monster drink (1-4 times per week) Sleep: does not feel she sleeps soundly, is sleeping through the night - does snore - no reported stopping breathing   # "migraine" -- photosensitivity ---- all over pain ---- pressure like - 2-3 times per month - more often if stress is worse - treatment: tylenol PM x 2 and rest w/ transition to a normal HA - will get jaw pain - has been told she grinds her teeth - endorses neck and shoulder pain - does not feel her neck/shoulder pain worsens HA - does get Nausea with these HA - no aura before the HA  Review of Systems   Social History   Tobacco Use  Smoking Status Never Smoker  Smokeless Tobacco Never Used        Objective:    BP Readings from Last 3 Encounters:  08/26/19 119/82  07/31/19 100/74  12/30/15 106/73   Wt Readings from Last 3 Encounters:  08/26/19 197 lb 12 oz (89.7 kg)  07/31/19 194 lb (88 kg)  12/30/15 192 lb (87.1 kg)    BP 119/82   Pulse 75   Temp 99.4 F (37.4 C) (Temporal)   Ht 5\' 2"  (1.575 m)   Wt 197 lb 12 oz (89.7 kg)   SpO2 97%   BMI 36.17 kg/m    Physical Exam Constitutional:      General: She is not  in acute distress.    Appearance: She is well-developed. She is not diaphoretic.  HENT:     Right Ear: External ear normal.     Left Ear: External ear normal.     Nose: Nose normal.  Eyes:     Conjunctiva/sclera: Conjunctivae normal.  Cardiovascular:     Rate and Rhythm: Normal rate.  Pulmonary:     Effort: Pulmonary effort is normal.  Musculoskeletal:     Cervical back: Neck supple. Tenderness (along the trapezius) present. No rigidity.  Skin:    General: Skin is warm and dry.     Capillary Refill: Capillary refill takes less than 2 seconds.  Neurological:     Mental Status: She is alert. Mental status is at baseline.  Psychiatric:        Mood and Affect: Mood normal.        Behavior: Behavior normal.      GAD 7 : Generalized Anxiety Score 08/26/2019  Nervous, Anxious, on Edge 3  Control/stop worrying 3  Worry too much - different things 3  Trouble relaxing 3  Restless 3  Easily annoyed or irritable 1  Afraid - awful might happen  1  Total GAD 7 Score 17  Anxiety Difficulty Somewhat difficult     Depression screen PHQ 2/9 08/26/2019  Decreased Interest 1  Down, Depressed, Hopeless 2  PHQ - 2 Score 3  Altered sleeping 1  Tired, decreased energy 2  Change in appetite 1  Feeling bad or failure about yourself  2  Trouble concentrating 2  Moving slowly or fidgety/restless 1  Suicidal thoughts 0  PHQ-9 Score 12  Difficult doing work/chores Somewhat difficult        Assessment & Plan:   Problem List Items Addressed This Visit      Cardiovascular and Mediastinum   Migraine headache without aura    Seems to respond to sleep and tylenol. Continue this. Trial of riboflavin to see if we can reduce the number      Relevant Medications   FLUoxetine (PROZAC) 20 MG tablet     Nervous and Auditory   Chronic tension headaches    Advised reducing caffeine. Neck stretches/exercises for posture. Diary for triggers. Continue to limit medication to 3 times per week.        Relevant Medications   FLUoxetine (PROZAC) 20 MG tablet     Other   Generalized anxiety disorder with panic attacks    Pt reports possible hx of bipolar diagnosis but denies manic episodes. Discussed risk of this medication worsening and she will monitor. Start prozac. 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 Return 6 weeks. Hand out for mindfulness as therapy too expensive.       Relevant Medications   FLUoxetine (PROZAC) 20 MG tablet       Return in about 6 weeks (around 10/07/2019).  Lesleigh Noe, MD  This visit occurred during the SARS-CoV-2 public health emergency.  Safety protocols were in place, including screening questions prior to the visit, additional usage of staff PPE, and extensive cleaning of exam room while observing appropriate contact time as indicated for disinfecting solutions.

## 2019-08-26 NOTE — Assessment & Plan Note (Signed)
Seems to respond to sleep and tylenol. Continue this. Trial of riboflavin to see if we can reduce the number

## 2019-08-26 NOTE — Assessment & Plan Note (Signed)
Advised reducing caffeine. Neck stretches/exercises for posture. Diary for triggers. Continue to limit medication to 3 times per week.

## 2019-10-06 ENCOUNTER — Encounter: Payer: Self-pay | Admitting: Family Medicine

## 2019-10-06 ENCOUNTER — Telehealth (INDEPENDENT_AMBULATORY_CARE_PROVIDER_SITE_OTHER): Payer: Managed Care, Other (non HMO) | Admitting: Family Medicine

## 2019-10-06 VITALS — Temp 98.2°F

## 2019-10-06 DIAGNOSIS — J0140 Acute pansinusitis, unspecified: Secondary | ICD-10-CM

## 2019-10-06 MED ORDER — AMOXICILLIN-POT CLAVULANATE 875-125 MG PO TABS: 1.0000 | ORAL_TABLET | Freq: Two times a day (BID) | ORAL | 0 refills | Status: AC

## 2019-10-06 NOTE — Patient Instructions (Signed)
Based on your symptoms, it looks like you have a virus.   Antibiotics are not need for a viral infection but the following will help:   1. Drink plenty of fluids 2. Get lots of rest  Sinus Congestion 1) Neti Pot (Saline rinse) -- 2 times day -- if tolerated 2) Flonase (Store Brand ok) - once daily 3) Over the counter congestion medications  Cough 1) Cough drops can be helpful 2) Nyquil (or nighttime cough medication) 3) Honey is proven to be one of the best cough medications  4) Cough medicine with Dextromethorphan can also be helpful  Sore Throat 1) Honey as above, cough drops 2) Ibuprofen or Aleve can be helpful 3) Salt water Gargles

## 2019-10-06 NOTE — Progress Notes (Signed)
I connected with Chasiti Waddington on 10/06/19 at  4:00 PM EDT by video and verified that I am speaking with the correct person using two identifiers.   I discussed the limitations, risks, security and privacy concerns of performing an evaluation and management service by video and the availability of in person appointments. I also discussed with the patient that there may be a patient responsible charge related to this service. The patient expressed understanding and agreed to proceed.  Patient location: Home Provider Location: [facility] Participants: Lesleigh Noe and Ashlynn Gunnels   Subjective:     Caroline Patterson is a 29 y.o. female presenting for Cough (X 6 days    *took home covid test today and it was negative ), Sore Throat ("), Headache ("), Ear Pain ("), and Fever (only last thursday )     HPI   #Cough - started 6 days - no known covid exposure - works in a health care setting - no one with known covid - endorses HA, sore throat, ear pain - fever - which resolved - endorsing some chest congestion - no loss of taste or smell  - no sick contact - no face pressure - Wednesday had some facial pressure which improved  Tried: Theraflu which helps throughout the day  Lessons the symptoms - does not help with sore throat and helps with cough  Review of Systems  Constitutional: Positive for chills and fever.  HENT: Positive for sneezing.   Respiratory: Positive for cough and shortness of breath. Negative for wheezing.   Gastrointestinal: Positive for nausea. Negative for vomiting.  Musculoskeletal: Negative for arthralgias and myalgias.     Social History   Tobacco Use  Smoking Status Never Smoker  Smokeless Tobacco Never Used        Objective:   BP Readings from Last 3 Encounters:  08/26/19 119/82  07/31/19 100/74  12/30/15 106/73   Wt Readings from Last 3 Encounters:  08/26/19 197 lb 12 oz (89.7 kg)  07/31/19 194 lb (88 kg)    12/30/15 192 lb (87.1 kg)    Temp 98.2 F (36.8 C) (Temporal)    Physical Exam Constitutional:      Appearance: Normal appearance. She is not ill-appearing.  HENT:     Head: Normocephalic and atraumatic.     Right Ear: External ear normal.     Left Ear: External ear normal.  Eyes:     Conjunctiva/sclera: Conjunctivae normal.  Pulmonary:     Effort: Pulmonary effort is normal. No respiratory distress.  Neurological:     Mental Status: She is alert. Mental status is at baseline.  Psychiatric:        Mood and Affect: Mood normal.        Behavior: Behavior normal.        Thought Content: Thought content normal.        Judgment: Judgment normal.         Assessment & Plan:   Problem List Items Addressed This Visit    None    Visit Diagnoses    Acute non-recurrent pansinusitis    -  Primary   Relevant Medications   Chlorphen-Pseudoephed-APAP (THERAFLU FLU/COLD PO)   amoxicillin-clavulanate (AUGMENTIN) 875-125 MG tablet     Given duration of symptoms and negative covid test suspect that she may have developed a sinus infection.   Treat with abx   Return if not improved  OK to return to work, cont wearing masks.  Retest if new symptoms.   No follow-ups on file.  Lesleigh Noe, MD

## 2019-10-08 ENCOUNTER — Encounter: Payer: Self-pay | Admitting: Family Medicine

## 2020-01-01 ENCOUNTER — Telehealth: Payer: Self-pay

## 2020-01-01 NOTE — Telephone Encounter (Signed)
Called and updated patient of Dr. Elmyra Ricks recommendations. Patient stated that she has tried to get a covid test, but everywhere has no availability. UC and ED precautions given. Patient verbalized understanding.

## 2020-01-01 NOTE — Telephone Encounter (Signed)
[facility] - Client TELEPHONE ADVICE RECORD Access[facility]urse Patient Name: Caroline Patterson [facility] Gender: Female DOB: [birth year] Age: 29 Y 9 M 25 D Return Phone Number: [phone] (Primary) Address: City/State/Zip: [place] [address] Client [facility] - Client Client Site [facility] - Day Physician Gweneth Dimitri- MD Contact Type Call Who Is Calling Patient / Member / Family / Caregiver Call Type Triage / Clinical Relationship To Patient Self Return Phone Number [phone] (Primary) Chief Complaint Fever (non urgent symptom) (> THREE MONTHS) Reason for Call Symptomatic / Request for Health Information Initial Comment Caller states has a fever 100.1. Has sore throat, cough, and body aches. Translation No Nurse Assessment Nurse: Letitia Libra, RN, Shaun Date/Time (Eastern Time): 01/01/2020 8:58:53 AM Confirm and document reason for call. If symptomatic, describe symptoms. ---Caller states has a fever 100.1 oral. Temp 104 oral yesterday. Treated with Dayquil. Has sore throat, cough, and body aches. Chills and sinus congestion. onset yesterday AM. No other symptoms. Does the patient have any new or worsening symptoms? ---Yes Will a triage be completed? ---Yes Related visit to physician within the last 2 weeks? ---No Does the PT have any chronic conditions? (i.e. diabetes, asthma, this includes High risk factors for pregnancy, etc.) ---No Is the patient pregnant or possibly pregnant? (Ask all females between the ages of 22-55) ---No Is this a behavioral health or substance abuse call? ---No Guidelines Guideline Title Affirmed Question Affirmed Notes Nurse Date/Time (Eastern Time) COVID-19 - Diagnosed or Suspected [1] COVID-19 infection suspected by caller or triager AND [2] mild symptoms (cough, fever, or others) AND [3] negative COVID-19 rapid test Letitia Libra, RN, Shaun 01/01/2020 9:02:08 AM Disp. Time Lamount Cohen  Time) Disposition Final User 01/01/2020 9:09:18 AM Call PCP when Office is Open Yes Letitia Libra, RN, Shaun PLEASE NOTE: All timestamps contained within this report are represented as [place] Standard Time. CONFIDENTIALTY NOTICE: This fax transmission is intended only for the addressee. It contains information that is legally privileged, confidential or otherwise protected from use or disclosure. If you are not the intended recipient, you are strictly prohibited from reviewing, disclosing, copying using or disseminating any of this information or taking any action in reliance on or regarding this information. If you have received this fax in error, please notify us immediately by telephone so that we can arrange for its return to [place]. Phone: [phone], Toll-Free: [phone], Fax: [phone] Page: 2 of 2 Call Id: [id number] Caller Disagree/Comply Comply Caller Understands Yes PreDisposition Call Doctor Care Advice Given Per Guideline CALL PCP WHEN OFFICE IS OPEN: * You need to discuss this with your doctor (or NP/PA) within the next few days. * Call the office when it is open. REASSURANCE AND EDUCATION - SUSPECTED COVID-19 AND NEGATIVE RAPID COVID-19 TEST: * Negative rapid test results are usually accurate, but can sometimes be wrong. * Positive rapid test results are accurate and can be trusted. * An error is more likely with tests performed at home. Rapid tests performed at a test site are usually more accurate. * Your doctor (or NP/PA) can help you decide if another special test (such as a PCR test) is needed. Talk with your doctor about your symptoms. * Here's some care advice to help you and to help prevent others from getting sick. * Treat fevers above 101 F (38.3 C). The goal of fever therapy is to bring the fever down to a comfortable level. Remember that fever medicine usually lowers fever 2 degrees F (1 -  1 1/2 degrees C). * They are over-the-counter (OTC) drugs that help treat both  fever and pain. You can buy them at the drugstore. PAIN AND FEVER MEDICINES: * For pain or fever relief, take either acetaminophen or ibuprofen. * ACETAMINOPHEN REGULAR STRENGTH TYLENOL: Take 650 mg (two 325 mg pills) by mouth every 4 to 6 hours as needed. Each Regular Strength Tylenol pill has 325 mg of acetaminophen. The most you should take each day is 3,250 mg (10 pills a day). CALL BACK IF: * Fever over 103 F (39.4 C) * Fever lasts over 3 days * Fever returns after being gone for 24 hours * Chest pain or difficulty breathing occurs * You become worse CARE ADVICE given per COVID-19 - DIAGNOSED OR SUSPECTED (Adult) guideline. After Care Instructions Given Call Event Type User Date / Time Description Education document email Bobetta Lime, Shaun 01/01/2020 9:15:30 AM COVID-19 Diagnosed or Suspected Education document email Letitia Libra, RN, Shaun 01/01/2020 9:15:30 AM COVID-19 Vaccines - Answers to Common Questions Education document email Letitia Libra, RN, Shaun 01/01/2020 9:15:30 AM What To Do If You Are Sick With COVID-19_English

## 2020-01-01 NOTE — Telephone Encounter (Signed)
Would recommend covid testing and isolating. Over the counter care

## 2020-01-20 ENCOUNTER — Other Ambulatory Visit: Payer: Managed Care, Other (non HMO)

## 2020-01-27 ENCOUNTER — Other Ambulatory Visit (HOSPITAL_COMMUNITY)
Admission: RE | Admit: 2020-01-27 | Discharge: 2020-01-27 | Disposition: A | Discharge status: Home / Self Care | Payer: Managed Care, Other (non HMO) | Source: Ambulatory Visit | Attending: Family Medicine | Admitting: Family Medicine

## 2020-01-27 ENCOUNTER — Other Ambulatory Visit: Payer: Self-pay

## 2020-01-27 ENCOUNTER — Ambulatory Visit (INDEPENDENT_AMBULATORY_CARE_PROVIDER_SITE_OTHER): Payer: Managed Care, Other (non HMO) | Admitting: Family Medicine

## 2020-01-27 ENCOUNTER — Encounter: Payer: Self-pay | Admitting: Family Medicine

## 2020-01-27 VITALS — BP 110/80 | HR 71 | Temp 98.1°F | Ht 62.25 in | Wt 200.5 lb

## 2020-01-27 DIAGNOSIS — Z124 Encounter for screening for malignant neoplasm of cervix: Secondary | ICD-10-CM | POA: Insufficient documentation

## 2020-01-27 DIAGNOSIS — Z136 Encounter for screening for cardiovascular disorders: Secondary | ICD-10-CM | POA: Diagnosis not present

## 2020-01-27 DIAGNOSIS — E669 Obesity, unspecified: Secondary | ICD-10-CM

## 2020-01-27 DIAGNOSIS — Z0000 Encounter for general adult medical examination without abnormal findings: Secondary | ICD-10-CM

## 2020-01-27 DIAGNOSIS — Z1159 Encounter for screening for other viral diseases: Secondary | ICD-10-CM | POA: Diagnosis not present

## 2020-01-27 LAB — COMPREHENSIVE METABOLIC PANEL
ALT: 17 U/L (ref 0–35)
AST: 18 U/L (ref 0–37)
Albumin: 4.2 g/dL (ref 3.5–5.2)
Alkaline Phosphatase: 38 U/L — ABNORMAL LOW (ref 39–117)
BUN: 12 mg/dL (ref 6–23)
CO2: 27 mEq/L (ref 19–32)
Calcium: 9.7 mg/dL (ref 8.4–10.5)
Chloride: 105 mEq/L (ref 96–112)
Creatinine, Ser: 0.69 mg/dL (ref 0.40–1.20)
GFR: 115.26 mL/min (ref >60.00)
Glucose, Bld: 97 mg/dL (ref 70–99)
Potassium: 4.3 mEq/L (ref 3.5–5.1)
Sodium: 138 mEq/L (ref 135–145)
Total Bilirubin: 0.5 mg/dL (ref 0.2–1.2)
Total Protein: 6.9 g/dL (ref 6.0–8.3)

## 2020-01-27 LAB — CBC
HCT: 39.3 % (ref 36.0–46.0)
Hemoglobin: 13.6 g/dL (ref 12.0–15.0)
MCHC: 34.5 g/dL (ref 30.0–36.0)
MCV: 91.4 fl (ref 78.0–100.0)
Platelets: 218 10*3/uL (ref 150.0–400.0)
RBC: 4.3 Mil/uL (ref 3.87–5.11)
RDW: 12.8 % (ref 11.5–15.5)
WBC: 5.7 10*3/uL (ref 4.0–10.5)

## 2020-01-27 LAB — LIPID PANEL
Cholesterol: 192 mg/dL (ref 0–200)
HDL: 49.5 mg/dL (ref >39.00)
LDL Cholesterol: 118 mg/dL — ABNORMAL HIGH (ref 0–99)
NonHDL: 142.09
Total CHOL/HDL Ratio: 4
Triglycerides: 120 mg/dL (ref 0.0–149.0)
VLDL: 24 mg/dL (ref 0.0–40.0)

## 2020-01-27 LAB — TSH: TSH: 1.74 u[IU]/mL (ref 0.35–4.50)

## 2020-01-27 NOTE — Progress Notes (Signed)
Annual Exam   Chief Complaint:  Chief Complaint  Patient presents with  . Annual Exam  . Hearing Problem    Reports sounds are muffled, mostly out of left side  . Gynecologic Exam    History of Present Illness:  [name] is a 30 y.o. No obstetric history on file. who LMP was No LMP recorded. Patient has had an implant., presents today for her annual examination.      Nutrition/Lifestyle Diet: not great, is starting to switch to low carb diet  Exercise: working on adding yoga daily, walking the dogs She does not get adequate calcium and Vitamin D in her diet.  Social History   Tobacco Use  Smoking Status Never Smoker  Smokeless Tobacco Never Used   Social History   Substance and Sexual Activity  Alcohol Use Yes   Comment: 1-2 times a week, 1-2 drinks   Social History   Substance and Sexual Activity  Drug Use No     Safety The patient wears seatbelts: yes.     The patient feels safe at home and in their relationships: yes.  General Health Dentist in the last year: No Eye doctor: yes  Menstrual Has implant Irregular cycles - misses months, sometimes light and heavy  GYN She is single partner, contraception - nexplanon.     Cervical Cancer Screening (Age 32-65) Last Pap:  January 2019 Results were: no abnormalities with HPV not done  Family History of Breast Cancer: no Family History of Ovarian Cancer: no    Weight Wt Readings from Last 3 Encounters:  01/27/20 200 lb 8 oz (90.9 kg)  08/26/19 197 lb 12 oz (89.7 kg)  07/31/19 194 lb (88 kg)   Patient has high BMI  BMI Readings from Last 1 Encounters:  01/27/20 36.38 kg/m     Chronic disease screening Blood pressure monitoring:  BP Readings from Last 3 Encounters:  01/27/20 110/80  08/26/19 119/82  07/31/19 100/74     Lipid Monitoring: Indication for screening: age >38, obesity, diabetes, family hx, CV risk factors.  Lipid screening: Yes  No results found for: CHOL, HDL,  LDLCALC, LDLDIRECT, TRIG, CHOLHDL   Diabetes Screening: age >70, overweight, family hx, PCOS, hx of gestational diabetes, at risk ethnicity, elevated blood pressure >135/80.  Diabetes Screening screening: Yes  No results found for: HGBA1C    Past Medical History:  Diagnosis Date  . Ankle fracture 12/2015   left    Past Surgical History:  Procedure Laterality Date  . INGUINAL HERNIA REPAIR    . ORIF ANKLE FRACTURE Left 12/30/2015   Procedure: OPEN REDUCTION INTERNAL FIXATION (ORIF) LEFT ANKLE SYNDESMOSIS;  Surgeon: Earlie Server, MD;  Location: [facility];  Service: Orthopedics;  Laterality: Left;  . WRIST SURGERY     tendon repair    Prior to Admission medications   Medication Sig Start Date End Date Taking? Authorizing Provider  etonogestrel (NEXPLANON) 68 MG IMPL implant 1 each by Subdermal route once.   Yes [provider]  FLUoxetine (PROZAC) 20 MG tablet Take 1/2 tablet for 1 week. Then take 1 tablet daily. Patient not taking: Reported on 01/27/2020 08/26/19   Lesleigh Noe, MD    No Known Allergies  Gynecologic History: No LMP recorded. Patient has had an implant.  Obstetric History: No obstetric history on file.  Social History   Socioeconomic History  . Marital status: Single    Spouse name: Not on file  . Number of children: 1  .  Years of education: Not on file  . Highest education level: Associate degree: academic program  Occupational History  . Occupation: Librarian, academic  Tobacco Use  . Smoking status: Never Smoker  . Smokeless tobacco: Never Used  Vaping Use  . Vaping Use: Never used  Substance and Sexual Activity  . Alcohol use: Yes    Comment: 1-2 times a week, 1-2 drinks  . Drug use: No  . Sexual activity: Yes    Partners: Male    Birth control/protection: Implant  Other Topics Concern  . 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, petroleum      Family: daughter - Caroline Patterson (2008)      Enjoys: Barrister's clerk, art projects, reading      Exercise: walking dogs at least once a day - walks about 2 miles   Diet: tries to eat healthy, veggies, water, monitor what she eats/drinks, avoids sweets      Safety   Seat belts: Yes    Guns: Yes  and secure   Safe in relationships: Yes    Social Determinants of Radio broadcast assistant Strain: Not on file  Food Insecurity: Not on file  Transportation Needs: Not on file  Physical Activity: Not on file  Stress: Not on file  Social Connections: Not on file  Intimate Partner Violence: Not on file    Family History  Problem Relation Age of Onset  . Depression Mother   . Diabetes Mother   . High Cholesterol Mother   . Miscarriages / [place] Mother   . Alcohol abuse Father   . Depression Father   . Drug abuse Father   . High Cholesterol Father   . High blood pressure Father   . Alcohol abuse Brother   . Diabetes Maternal Grandmother   . Heart attack Maternal Grandfather   . Rheum arthritis Paternal Grandmother   . Kidney disease Paternal Grandmother     Review of Systems  Constitutional: Negative for chills and fever.  HENT: Positive for hearing loss. Negative for congestion and sore throat.   Eyes: Negative for blurred vision and double vision.  Respiratory: Negative for shortness of breath.   Cardiovascular: Negative for chest pain.  Gastrointestinal: Negative for heartburn, nausea and vomiting.  Genitourinary: Negative.   Musculoskeletal: Negative.  Negative for myalgias.  Skin: Negative for rash.  Neurological: Negative for dizziness and headaches.  Endo/Heme/Allergies: Does not bruise/bleed easily.  Psychiatric/Behavioral: Negative for depression. The patient is not nervous/anxious.      Physical Exam BP 110/80   Pulse 71   Temp 98.1 F (36.7 C) (Temporal)   Ht 5' 2.25" (1.581 m)   Wt 200  lb 8 oz (90.9 kg)   SpO2 98%   BMI 36.38 kg/m    BP Readings from Last 3 Encounters:  01/27/20 110/80  08/26/19 119/82  07/31/19 100/74    Wt Readings from Last 3 Encounters:  01/27/20 200 lb 8 oz (90.9 kg)  08/26/19 197 lb 12 oz (89.7 kg)  07/31/19 194 lb (88 kg)     Physical Exam Exam conducted with a chaperone present.  Constitutional:      General: She is not in acute distress.    Appearance: She is well-developed and well-nourished. She is not diaphoretic.  HENT:     Head: Normocephalic and  atraumatic.     Right Ear: External ear normal.     Left Ear: External ear normal.     Nose: Nose normal.     Mouth/Throat:     Mouth: Oropharynx is clear and moist.  Eyes:     General: No scleral icterus.    Extraocular Movements: EOM normal.     Conjunctiva/sclera: Conjunctivae normal.  Cardiovascular:     Rate and Rhythm: Normal rate and regular rhythm.     Heart sounds: No murmur heard.   Pulmonary:     Effort: Pulmonary effort is normal. No respiratory distress.     Breath sounds: Normal breath sounds. No wheezing.  Abdominal:     General: Bowel sounds are normal. There is no distension.     Palpations: Abdomen is soft. There is no mass.     Tenderness: There is no abdominal tenderness. There is no guarding or rebound.  Genitourinary:    Cervix: Erythema present.  Musculoskeletal:        General: No edema. Normal range of motion.     Cervical back: Neck supple.  Lymphadenopathy:     Cervical: No cervical adenopathy.  Skin:    General: Skin is warm and dry.     Capillary Refill: Capillary refill takes less than 2 seconds.  Neurological:     Mental Status: She is alert and oriented to person, place, and time.     Deep Tendon Reflexes: Strength normal. Reflexes normal.  Psychiatric:        Mood and Affect: Mood and affect normal.        Behavior: Behavior normal.       Results: Depression screen [facility] 2/9 01/27/2020 08/26/2019  Decreased Interest 1 1  Down,  Depressed, Hopeless 1 2  PHQ - 2 Score 2 3  Altered sleeping 1 1  Tired, decreased energy 1 2  Change in appetite 1 1  Feeling bad or failure about yourself  0 2  Trouble concentrating 1 2  Moving slowly or fidgety/restless 0 1  Suicidal thoughts 0 0  PHQ-9 Score 6 12  Difficult doing work/chores Not difficult at all Somewhat difficult      Assessment: 30 y.o. No obstetric history on file. female here for routine annual examination.  Plan: Problem List Items Addressed This Visit   None   Visit Diagnoses    Encounter for physical examination    -  Primary   Relevant Orders   Comprehensive metabolic panel   TSH   CBC   Encounter for special screening examination for cardiovascular disorder       Relevant Orders   Lipid panel   Need for hepatitis C screening test       Relevant Orders   Hepatitis C antibody   Obesity (BMI 35.0-39.9 without comorbidity)       Relevant Orders   Comprehensive metabolic panel   TSH   CBC   Cervical cancer screening       Relevant Orders   Cytology - PAP       Screening: -- Blood pressure screen normal -- cholesterol screening: will obtain -- Weight screening: obese: discussed management options, including lifestyle, dietary, and exercise. -- Diabetes Screening: will obtain -- Nutrition: encouraged healthy diet   Psych -- Depression screening (PHQ-9):  [place] Office Visit from 01/27/2020 in [place] at [facility]  PHQ-9 Total Score 6       Safety -- tobacco screening: not using -- alcohol screening:  low-risk usage. --  no evidence of domestic violence or intimate partner violence.  Cancer Screening -- pap smear collected per ASCCP guidelines -- family history of breast cancer screening: done. not at high risk.   Immunizations  There is no immunization history on file for this patient.  -- flu vaccine declined -- TDAP q10 years unknown, record requested -- HPV vaccination series (Age <26, shared  decision 27-45): started, but never finished -- Covid-19 Vaccine declined  Encouraged regular vision and dental screening. Encouraged healthy exercise and diet.   Lesleigh Noe

## 2020-01-27 NOTE — Patient Instructions (Signed)
Preventive Care 21-32 Years Old, Female Preventive care refers to lifestyle choices and visits with your health care provider that can promote health and wellness. This includes:  A yearly physical exam. This is also called an annual wellness visit.  Regular dental and eye exams.  Immunizations.  Screening for certain conditions.  Healthy lifestyle choices, such as: ? Eating a healthy diet. ? Getting regular exercise. ? Not using drugs or products that contain nicotine and tobacco. ? Limiting alcohol use. What can I expect for my preventive care visit? Physical exam Your health care provider may check your:  Height and weight. These may be used to calculate your BMI (body mass index). BMI is a measurement that tells if you are at a healthy weight.  Heart rate and blood pressure.  Body temperature.  Skin for abnormal spots. Counseling Your health care provider may ask you questions about your:  Past medical problems.  Family's medical history.  Alcohol, tobacco, and drug use.  Emotional well-being.  Home life and relationship well-being.  Sexual activity.  Diet, exercise, and sleep habits.  Work and work environment.  Access to firearms.  Method of birth control.  Menstrual cycle.  Pregnancy history. What immunizations do I need? Vaccines are usually given at various ages, according to a schedule. Your health care provider will recommend vaccines for you based on your age, medical history, and lifestyle or other factors, such as travel or where you work.   What tests do I need? Blood tests  Lipid and cholesterol levels. These may be checked every 5 years starting at age 20.  Hepatitis C test.  Hepatitis B test. Screening  Diabetes screening. This is done by checking your blood sugar (glucose) after you have not eaten for a while (fasting).  STD (sexually transmitted disease) testing, if you are at risk.  BRCA-related cancer screening. This may be  done if you have a family history of breast, ovarian, tubal, or peritoneal cancers.  Pelvic exam and Pap test. This may be done every 3 years starting at age 21. Starting at age 30, this may be done every 5 years if you have a Pap test in combination with an HPV test. Talk with your health care provider about your test results, treatment options, and if necessary, the need for more tests.   Follow these instructions at home: Eating and drinking  Eat a healthy diet that includes fresh fruits and vegetables, whole grains, lean protein, and low-fat dairy products.  Take vitamin and mineral supplements as recommended by your health care provider.  Do not drink alcohol if: ? Your health care provider tells you not to drink. ? You are pregnant, may be pregnant, or are planning to become pregnant.  If you drink alcohol: ? Limit how much you have to 0-1 drink a day. ? Be aware of how much alcohol is in your drink. In the [place] one drink equals one 12 oz bottle of beer (355 mL), one 5 oz glass of wine (148 mL), or one 1 oz glass of hard liquor (44 mL).   Lifestyle  Take daily care of your teeth and gums. Brush your teeth every morning and night with fluoride toothpaste. Floss one time each day.  Stay active. Exercise for at least 30 minutes 5 or more days each week.  Do not use any products that contain nicotine or tobacco, such as cigarettes, e-cigarettes, and chewing tobacco. If you need help quitting, ask your health care provider.  Do not   use drugs.  If you are sexually active, practice safe sex. Use a condom or other form of protection to prevent STIs (sexually transmitted infections).  If you do not wish to become pregnant, use a form of birth control. If you plan to become pregnant, see your health care provider for a prepregnancy visit.  Find healthy ways to cope with stress, such as: ? Meditation, yoga, or listening to music. ? Journaling. ? Talking to a trusted  person. ? Spending time with friends and family. Safety  Always wear your seat belt while driving or riding in a vehicle.  Do not drive: ? If you have been drinking alcohol. Do not ride with someone who has been drinking. ? When you are tired or distracted. ? While texting.  Wear a helmet and other protective equipment during sports activities.  If you have firearms in your house, make sure you follow all gun safety procedures.  Seek help if you have been physically or sexually abused. What's next?  Go to your health care provider once a year for an annual wellness visit.  Ask your health care provider how often you should have your eyes and teeth checked.  Stay up to date on all vaccines. This information is not intended to replace advice given to you by your health care provider. Make sure you discuss any questions you have with your health care provider. Document Revised: 08/17/2019 Document Reviewed: 08/30/2017 Elsevier Patient Education  2021 Elsevier Inc.  

## 2020-01-28 LAB — HEPATITIS C ANTIBODY
Hepatitis C Ab: NONREACTIVE
SIGNAL TO CUT-OFF: 0.01 (ref <1.00)

## 2020-01-30 ENCOUNTER — Telehealth: Payer: Self-pay | Admitting: Family Medicine

## 2020-01-30 LAB — CYTOLOGY - PAP
Comment: NEGATIVE
Diagnosis: UNDETERMINED — AB
High risk HPV: NEGATIVE

## 2020-01-30 NOTE — Telephone Encounter (Signed)
Patient has some questions about her lab results. Patient is requesting a call back to discuss. EM

## 2020-01-31 NOTE — Telephone Encounter (Signed)
My chart message sent to pt.

## 2020-02-24 ENCOUNTER — Ambulatory Visit
Admission: RE | Admit: 2020-02-24 | Discharge: 2020-02-24 | Disposition: A | Discharge status: Home / Self Care | Payer: Managed Care, Other (non HMO) | Source: Ambulatory Visit | Attending: Family Medicine | Admitting: Family Medicine

## 2020-02-24 ENCOUNTER — Other Ambulatory Visit: Payer: Self-pay

## 2020-02-24 ENCOUNTER — Other Ambulatory Visit: Payer: Self-pay | Admitting: Family Medicine

## 2020-02-24 DIAGNOSIS — N6320 Unspecified lump in the left breast, unspecified quadrant: Secondary | ICD-10-CM

## 2020-02-25 ENCOUNTER — Ambulatory Visit
Admission: RE | Admit: 2020-02-25 | Discharge: 2020-02-25 | Disposition: A | Discharge status: Home / Self Care | Payer: Managed Care, Other (non HMO) | Source: Ambulatory Visit | Attending: Family Medicine | Admitting: Family Medicine

## 2020-02-25 DIAGNOSIS — N6320 Unspecified lump in the left breast, unspecified quadrant: Secondary | ICD-10-CM

## 2020-03-03 ENCOUNTER — Telehealth: Payer: Self-pay | Admitting: *Deleted

## 2020-03-03 DIAGNOSIS — D249 Benign neoplasm of unspecified breast: Secondary | ICD-10-CM

## 2020-03-03 NOTE — Telephone Encounter (Signed)
Patient left a voicemail stating that she was in about 6-7 months ago because she had a lump in her breast. Patient stated that she has had a mammogram and ultrasound. Patient stated that she has recently had a follow-up on the lump. Patient stated that she reached out to radiology about having the lump removed and was told that she needed to reach out to her PCP about this. Patient stated that she does not know much about this and is reaching out to Dr. Einar Pheasant for her advice.

## 2020-03-03 NOTE — Telephone Encounter (Signed)
I placed a referral to surgery for a consult.

## 2020-03-03 NOTE — Telephone Encounter (Signed)
Spoke to pt and notified her that a referral was placed and they will call her soon.

## 2020-03-11 ENCOUNTER — Ambulatory Visit (INDEPENDENT_AMBULATORY_CARE_PROVIDER_SITE_OTHER): Payer: Managed Care, Other (non HMO) | Admitting: Surgery

## 2020-03-11 ENCOUNTER — Encounter: Payer: Self-pay | Admitting: Surgery

## 2020-03-11 ENCOUNTER — Ambulatory Visit: Payer: Self-pay | Admitting: Surgery

## 2020-03-11 ENCOUNTER — Other Ambulatory Visit: Payer: Self-pay

## 2020-03-11 VITALS — BP 118/79 | HR 76 | Temp 98.2°F | Ht 62.0 in | Wt 199.2 lb

## 2020-03-11 DIAGNOSIS — D242 Benign neoplasm of left breast: Secondary | ICD-10-CM

## 2020-03-11 NOTE — Patient Instructions (Signed)
      Fibroadenoma  A fibroadenoma is a lump (tumor) in the breast. The lump is benign. This means that it is not cancer. It may move under your skin when you touch it. This kind of lump can grow in one breast or in both breasts. What are the causes? The cause of this condition is not known. What increases the risk?  Being a woman between the ages of 22 and 55.  Being a woman of African American descent. What are the signs or symptoms? Some lumps are too small to be felt. If you can feel it, it may feel like a lump that is:  Firm.  Round.  Smooth.  Able to move around a bit. How is this treated?  Regular breast exams are done to check for changes in the lump.  In some cases, the lump may be removed if: ? It is large. ? It keeps growing. ? It causes pain or changes in the skin of the breast. ? A young girl has a lump. Lumps in young girls tend to grow over time. Follow these instructions at home: Breast exams Check your breasts at home as told by your doctor. Report any changes or concerns. Check for the following:  The size of the lump.  The look and feel of the skin of your breasts.  The look and feel of your nipples.   General instructions  Do not smoke or use any products that contain nicotine or tobacco. These can further increase your cancer risk. If you need help quitting, ask your doctor.  Keep all follow-up visits. You will need breast exams on a regular basis. Contact a doctor if:  The lump changes in size or feels different.  The lump starts to be painful.  You find a new lump.  You have any changes in how the skin on your breast looks.  You have any changes in your nipple, such as: ? Fluid leaking from your nipple. ? Redness around your nipple. Summary  A fibroadenoma is a lump (tumor) in the breast. The lump is benign. This means that it is not cancer.  This may feel firm, round, or smooth, and it may move around a bit when touched. Some  lumps are too small to be felt.  Do breast exams at home. Watch for changes in the size of the lump.  Contact your doctor if the lump grows bigger or starts to cause pain. Also, let your doctor know if you have any changes in your nipple or in how the skin on your breast looks. This information is not intended to replace advice given to you by your health care provider. Make sure you discuss any questions you have with your health care provider. Document Revised: 10/20/2019 Document Reviewed: 10/20/2019 Elsevier Patient Education  2021 [employer].

## 2020-03-11 NOTE — Progress Notes (Signed)
Patient ID: Caroline Patterson, female   DOB: 1990/06/11, 30 y.o.   MRN: [id number]  Chief Complaint: Left breast fibroadenoma, left breast pain  History of Present Illness Caroline Patterson is a 30 y.o. female with a 11-month follow-up set of imaging following a mammography obtained because she was over 56.  She originally presented about 6 months ago with an issue in her right breast, and ultrasound was obtained which she reports was negative.  However because of her age he obtained a mammogram, and subsequent ultrasound showed what she reports is a 1 cm lesion in her left breast.  This was unknown to her.  This was not palpable.  When asked when her left breast pain began, she reports that a month prior to her biopsy, she began having some pain in the left breast.  Follow-up imaging then showed an increase in size in her left breast she reports 2 cm.  Core biopsy was obtained, confirming its status as a fibroadenoma.  No surgical referral was made from radiology.  Their plan was to follow-up any change in size and potentially rebiopsy as needed.  She presents today because she desires excision.  Upon my discussion with her that excision is not always sufficient and resolving breast pain, she wanted the benign lesion removed because of potential for growth.  Despite discussing with her that there is little threat of malignant transformation, and considering her imaging it was felt prudent to leave known benign lesions alone. We then prepared to proceed with examination.  She remarked that she felt she was wasting her time, assuming I have already determined that she would not have an excisional biopsy.  I expressed that I have made no conclusions, that this is the norm rather than the exception.  And after examination she may be an exception and may warrant removal. She refused to be examined, she walked out of the exam room abruptly without completing the visit. Of note the breast history form was never  completed. Levada Dy was present throughout the entire encounter.  Past Medical History Past Medical History:  Diagnosis Date  . Ankle fracture 12/2015   left      Past Surgical History:  Procedure Laterality Date  . INGUINAL HERNIA REPAIR    . ORIF ANKLE FRACTURE Left 12/30/2015   Procedure: OPEN REDUCTION INTERNAL FIXATION (ORIF) LEFT ANKLE SYNDESMOSIS;  Surgeon: Earlie Server, MD;  Location: [facility];  Service: Orthopedics;  Laterality: Left;  . WRIST SURGERY     tendon repair    No Known Allergies  Current Outpatient Medications  Medication Sig Dispense Refill  . etonogestrel (NEXPLANON) 68 MG IMPL implant 1 each by Subdermal route once.     No current facility-administered medications for this visit.    Family History Family History  Problem Relation Age of Onset  . Depression Mother   . Diabetes Mother   . High Cholesterol Mother   . Miscarriages / [place] Mother   . Alcohol abuse Father   . Depression Father   . Drug abuse Father   . High Cholesterol Father   . High blood pressure Father   . Alcohol abuse Brother   . Diabetes Maternal Grandmother   . Heart attack Maternal Grandfather   . Rheum arthritis Paternal Grandmother   . Kidney disease Paternal Grandmother       Social History Social History   Tobacco Use  . Smoking status: Never Smoker  . Smokeless tobacco: Never Used  Vaping Use  .  Vaping Use: Never used  Substance Use Topics  . Alcohol use: Yes    Comment: 1-2 times a week, 1-2 drinks  . Drug use: No        Review of Systems  Constitutional: Negative.   HENT: Negative.   Eyes: Negative.   Respiratory: Negative.   Cardiovascular: Negative.   Gastrointestinal: Negative.   Genitourinary: Negative.   Skin: Negative.   Neurological: Positive for headaches.  Psychiatric/Behavioral: Negative.       Physical Exam Blood pressure 118/79, pulse 76, temperature 98.2 F (36.8 C), temperature source Oral, height 5'  2" (1.575 m), weight 199 lb 3.2 oz (90.4 kg), SpO2 98 %. Last Weight  Most recent update: 03/11/2020  1:27 PM   Weight  90.4 kg (199 lb 3.2 oz)            CONSTITUTIONAL: Well developed, and nourished, appropriately responsive and aware without distress.   EYES: Sclera non-icteric.   EARS, NOSE, MOUTH AND THROAT: Mask worn.    Hearing is intact to voice.  NECK: Trachea is midline, and there is no jugular venous distension.   NEUROLOGIC:  Motor appeared grossly normal.  Cranial nerves are grossly without defect. PSYCH:  Alert and oriented to person, place and time.  I felt her response, to a nonoperative approach was inappropriate, and she abruptly left without further discussion.   Data Reviewed I have personally reviewed what is currently available of the patient's imaging, recent labs and medical records.   Labs:  CBC Latest Ref Rng & Units 01/27/2020  WBC 4.0 - 10.5 K/uL 5.7  Hemoglobin 12.0 - 15.0 g/dL 13.6  Hematocrit 36.0 - 46.0 % 39.3  Platelets 150.0 - 400.0 K/uL 218.0   CMP Latest Ref Rng & Units 01/27/2020  Glucose 70 - 99 mg/dL 97  BUN 6 - 23 mg/dL 12  Creatinine 0.40 - 1.20 mg/dL 0.69  Sodium 135 - 145 mEq/L 138  Potassium 3.5 - 5.1 mEq/L 4.3  Chloride 96 - 112 mEq/L 105  CO2 19 - 32 mEq/L 27  Calcium 8.4 - 10.5 mg/dL 9.7  Total Protein 6.0 - 8.3 g/dL 6.9  Total Bilirubin 0.2 - 1.2 mg/dL 0.5  Alkaline Phos 39 - 117 U/L 38(L)  AST 0 - 37 U/L 18  ALT 0 - 35 U/L 17      Imaging: Radiology review:  CLINICAL DATA:  30 year old female with focal thickening in the OUTER RETROAREOLAR RIGHT breast.Baseline mammogram.  EXAM: DIGITAL DIAGNOSTIC BILATERAL MAMMOGRAM WITH CAD AND TOMO  ULTRASOUND BILATERAL BREAST  COMPARISON:  None.  ACR Breast Density Category c: The breast tissue is heterogeneously dense, which may obscure small masses.  FINDINGS: 2D/3D full field views of both breasts and spot compression view of the RIGHT breast demonstrate  circumscribed oval masses within the UPPER-OUTER LEFT breast and RETROAREOLAR LEFT breast.  No mammographic abnormalities are noted within the RIGHT breast.  No evidence of distortion or worrisome calcifications within either breast.  Mammographic images were processed with CAD.  Targeted ultrasound is performed, showing the following:  RIGHT breast:  No sonographic abnormality within the OUTER or RETROAREOLAR RIGHT breast, at the site of patient concern.  LEFT breast:  A 1 x 0.5 x 1.1 cm circumscribed oval hypoechoic parallel mass at the 9 o'clock position 1 cm from the nipple.  A 1.5 x 0.7 x 1.3 cm slightly lobulated circumscribed oval hypoechoic parallel at the 2:30 position 5 cm from the nipple.  No abnormal LEFT axillary lymph nodes are  identified.  IMPRESSION: 1. 1.1 cm OUTER RETROAREOLAR LEFT breast mass and 1.5 cm UPPER-OUTER LEFT breast mass, both likely benign and probably fibroadenomas. Follow-up ultrasound is recommended at 6, 12, and 24 months to assess stability. The patient concurs with this plan. 2. No mammographic or sonographic abnormality at the site of concern within the RIGHT breast.  RECOMMENDATION: LEFT breast ultrasound in 6 months.  I have discussed the findings and recommendations with the patient. If applicable, a reminder letter will be sent to the patient regarding the next appointment.  BI-RADS CATEGORY  3: Probably benign.   Electronically Signed   By: Margarette Canada M.D.   On: 08/19/2019 10:49  CLINICAL DATA:  First six-month follow-up for probably benign LEFT breast masses.  EXAM: ULTRASOUND OF THE LEFT BREAST  COMPARISON:  Previous exam(s).  FINDINGS: Targeted ultrasound is performed, showing a circumscribed oval parallel hypoechoic mass in the 9 o'clock location of the LEFT breast 1 centimeter from the nipple, measuring 1.0 x 0.5 x 1.1 centimeters. Previously mass measured 1.0 x 0.5 x 1.1  centimeters.  In the 2:30 o'clock location 5 centimeters from nipple, a macro lobulated parallel circumscribed mass is 1.6 x 1.9 x 1.1 centimeters, previously 1.3 x 1.5 x 0.7 centimeters. Internal blood flow is noted on Doppler evaluation.  Evaluation of the LEFT axilla is negative for adenopathy.  IMPRESSION: 1. Stable appearance of probably benign mass in the 9 o'clock location of the LEFT breast. 2. Solid mass in the 2:30 o'clock location of the LEFT breast shows slight interval growth. Biopsy is recommended to exclude malignancy. 3. No LEFT axillary adenopathy.  RECOMMENDATION: 1. Recommend ultrasound-guided core biopsy of mass in the 2:30 o'clock location of the LEFT breast. 2. If mass is benign, recommend follow-up LEFT breast ultrasound in 6 months to assess stability of the mass in the 9 o'clock location. 3. If mass is malignant or atypical, recommend biopsy of the mass in the 9 o'clock location of the LEFT breast.  I have discussed the findings and recommendations with the patient. If applicable, a reminder letter will be sent to the patient regarding the next appointment.  BI-RADS CATEGORY  4: Suspicious.   Electronically Signed   By: Nolon Nations M.D.   On: 02/24/2020 10:42  Assessment    Abnormal breast ultrasound, left Fibroadenoma left breast. Patient Active Problem List   Diagnosis Date Noted  . Generalized anxiety disorder with panic attacks 08/26/2019  . Chronic tension headaches 08/26/2019  . Migraine headache without aura 08/26/2019  . Breast mass, left 07/31/2019    Plan    We will be glad to resume care for this patient should she return. As of current I would recommend follow-up imaging as radiology has planned for the unbiopsied lesion in the left breast.  Would not pursue excision of the known fibroadenoma.  Face-to-face time spent with the patient and accompanying care providers(if present) was 15 minutes, without completion of  the visit.    Ronny Bacon M.D., FACS 03/11/2020, 1:42 PM

## 2020-07-09 ENCOUNTER — Other Ambulatory Visit: Payer: Self-pay | Admitting: Family Medicine

## 2020-07-09 DIAGNOSIS — N6320 Unspecified lump in the left breast, unspecified quadrant: Secondary | ICD-10-CM

## 2020-08-25 ENCOUNTER — Ambulatory Visit
Admission: RE | Admit: 2020-08-25 | Discharge: 2020-08-25 | Disposition: A | Discharge status: Home / Self Care | Payer: Medicaid Other | Source: Ambulatory Visit | Attending: Family Medicine | Admitting: Family Medicine

## 2020-08-25 ENCOUNTER — Other Ambulatory Visit: Payer: Self-pay

## 2020-08-25 DIAGNOSIS — N6320 Unspecified lump in the left breast, unspecified quadrant: Secondary | ICD-10-CM

## 2021-06-29 ENCOUNTER — Encounter: Payer: Medicaid Other | Admitting: Family Medicine

## 2021-06-30 ENCOUNTER — Ambulatory Visit (INDEPENDENT_AMBULATORY_CARE_PROVIDER_SITE_OTHER): Payer: 59 | Admitting: Family Medicine

## 2021-06-30 VITALS — BP 100/80 | HR 81 | Temp 97.9°F | Ht 62.0 in | Wt 198.1 lb

## 2021-06-30 DIAGNOSIS — Z01818 Encounter for other preprocedural examination: Secondary | ICD-10-CM | POA: Diagnosis not present

## 2021-06-30 DIAGNOSIS — E669 Obesity, unspecified: Secondary | ICD-10-CM | POA: Diagnosis not present

## 2021-06-30 DIAGNOSIS — R202 Paresthesia of skin: Secondary | ICD-10-CM | POA: Insufficient documentation

## 2021-06-30 DIAGNOSIS — E782 Mixed hyperlipidemia: Secondary | ICD-10-CM

## 2021-06-30 DIAGNOSIS — Z0000 Encounter for general adult medical examination without abnormal findings: Secondary | ICD-10-CM | POA: Diagnosis not present

## 2021-06-30 DIAGNOSIS — Z6836 Body mass index (BMI) 36.0-36.9, adult: Secondary | ICD-10-CM | POA: Diagnosis not present

## 2021-06-30 LAB — COMPREHENSIVE METABOLIC PANEL
ALT: 14 U/L (ref 0–35)
AST: 16 U/L (ref 0–37)
Albumin: 4.1 g/dL (ref 3.5–5.2)
Alkaline Phosphatase: 36 U/L — ABNORMAL LOW (ref 39–117)
BUN: 11 mg/dL (ref 6–23)
CO2: 26 mEq/L (ref 19–32)
Calcium: 8.9 mg/dL (ref 8.4–10.5)
Chloride: 104 mEq/L (ref 96–112)
Creatinine, Ser: 0.66 mg/dL (ref 0.40–1.20)
GFR: 115.34 mL/min (ref >60.00)
Glucose, Bld: 86 mg/dL (ref 70–99)
Potassium: 4 mEq/L (ref 3.5–5.1)
Sodium: 138 mEq/L (ref 135–145)
Total Bilirubin: 0.7 mg/dL (ref 0.2–1.2)
Total Protein: 6.7 g/dL (ref 6.0–8.3)

## 2021-06-30 LAB — CBC
HCT: 39.7 % (ref 36.0–46.0)
Hemoglobin: 13.5 g/dL (ref 12.0–15.0)
MCHC: 34 g/dL (ref 30.0–36.0)
MCV: 92.3 fl (ref 78.0–100.0)
Platelets: 211 10*3/uL (ref 150.0–400.0)
RBC: 4.3 Mil/uL (ref 3.87–5.11)
RDW: 12.5 % (ref 11.5–15.5)
WBC: 6.4 10*3/uL (ref 4.0–10.5)

## 2021-06-30 LAB — LIPID PANEL
Cholesterol: 176 mg/dL (ref 0–200)
HDL: 51.8 mg/dL (ref >39.00)
LDL Cholesterol: 98 mg/dL (ref 0–99)
NonHDL: 124.05
Total CHOL/HDL Ratio: 3
Triglycerides: 129 mg/dL (ref 0.0–149.0)
VLDL: 25.8 mg/dL (ref 0.0–40.0)

## 2021-06-30 NOTE — Progress Notes (Signed)
Annual Exam   Chief Complaint:  Chief Complaint  Patient presents with   Annual Exam    No pap   Tingling    R hand, off and on x 2 years but more consistent in the last year. Has had tendon sx in that wrist.     History of Present Illness:  [name] is a 31 y.o. No obstetric history on file. who LMP was No LMP recorded. Patient has had an implant., presents today for her annual examination.    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  Social History   Tobacco Use  Smoking Status Never  Smokeless Tobacco Never   Social History   Substance and Sexual Activity  Alcohol Use Yes   Comment: 1-2 times a week, 1-2 drinks   Social History   Substance and Sexual Activity  Drug Use No     Safety The patient wears seatbelts: yes.     The patient feels safe at home and in their relationships: yes.  General Health Dentist in the last year: No Eye doctor: no  Menstrual On the implant - irregular, sometimes no period but more regular  GYN She is not sexually active.     Cervical Cancer Screening (Age 14-65) Last Pap:  January 2022 Results were: ASCUS with NEGATIVE high risk HPV   Family History of Breast Cancer: no Family History of Ovarian Cancer: no    Weight Wt Readings from Last 3 Encounters:  06/30/21 198 lb 2 oz (89.9 kg)  03/11/20 199 lb 3.2 oz (90.4 kg)  01/27/20 200 lb 8 oz (90.9 kg)   Patient has high BMI  BMI Readings from Last 1 Encounters:  06/30/21 36.24 kg/m     Chronic disease screening Blood pressure monitoring:  BP Readings  from Last 3 Encounters:  06/30/21 100/80  03/11/20 118/79  01/27/20 110/80     Lipid Monitoring: Indication for screening: age >13, obesity, diabetes, family hx, CV risk factors.  Lipid screening: Yes  Lab Results  Component Value Date   CHOL 192 01/27/2020   HDL 49.50 01/27/2020   LDLCALC 118 (H) 01/27/2020   TRIG 120.0 01/27/2020   CHOLHDL 4 01/27/2020     Diabetes Screening: age >85, overweight, family hx, PCOS, hx of gestational diabetes, at risk ethnicity, elevated blood pressure >135/80.  Diabetes Screening screening: Yes  No results found for: "HGBA1C"    Past Medical History:  Diagnosis Date   Ankle fracture 12/2015   left    Past Surgical History:  Procedure Laterality Date   INGUINAL HERNIA REPAIR     ORIF ANKLE FRACTURE Left 12/30/2015   Procedure: OPEN REDUCTION INTERNAL FIXATION (ORIF) LEFT ANKLE SYNDESMOSIS;  Surgeon: Earlie Server, MD;  Location: [facility];  Service: Orthopedics;  Laterality: Left;   WRIST SURGERY     tendon repair    Prior to Admission medications   Medication Sig Start Date End Date Taking? Authorizing Provider  etonogestrel (NEXPLANON) 68 MG IMPL implant 1 each by Subdermal route once.  Yes [provider]    No Known Allergies  Gynecologic History: No LMP recorded. Patient has had an implant.  Obstetric History: No obstetric history on file.  Social History   Socioeconomic History   Marital status: Single    Spouse name: Not on file   Number of children: 1   Years of education: Not on file   Highest education level: Associate degree: academic program  Occupational History   Occupation: Librarian, academic  Tobacco Use   Smoking status: Never   Smokeless tobacco: Never  Vaping Use   Vaping Use: Never used  Substance and Sexual Activity   Alcohol use: Yes    Comment: 1-2 times a week, 1-2 drinks   Drug use: No   Sexual activity: Yes    Partners: Male    Birth control/protection: Implant   Other Topics Concern   Not on file  Social History Narrative   08/26/19   From: the area   Living: with daughter   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, petroleum      Family: daughter - Vernie Shanks (2008)      Enjoys: Barrister's clerk, art projects, reading      Exercise: walking dogs at least once a day - walks about 2 miles   Diet: tries to eat healthy, veggies, water, monitor what she eats/drinks, avoids sweets      Safety   Seat belts: Yes    Guns: Yes  and secure   Safe in relationships: Yes    Social Determinants of Radio broadcast assistant Strain: Not on file  Food Insecurity: Not on file  Transportation Needs: Not on file  Physical Activity: Not on file  Stress: Not on file  Social Connections: Not on file  Intimate Partner Violence: Not on file    Family History  Problem Relation Age of Onset   Depression Mother    Diabetes Mother    High Cholesterol Mother    Miscarriages / Stillbirths Mother    Alcohol abuse Father    Depression Father    Drug abuse Father    High Cholesterol Father    High blood pressure Father    Alcohol abuse Brother    Diabetes Maternal Grandmother    Heart attack Maternal Grandfather    Rheum arthritis Paternal Grandmother    Kidney disease Paternal Grandmother     Review of Systems  Constitutional:  Negative for chills and fever.  HENT:  Negative for congestion and sore throat.   Eyes:  Negative for blurred vision and double vision.  Respiratory:  Negative for shortness of breath.   Cardiovascular:  Negative for chest pain.  Gastrointestinal:  Negative for heartburn, nausea and vomiting.  Genitourinary: Negative.   Musculoskeletal: Negative.  Negative for myalgias.  Skin:  Negative for rash.  Neurological:  Negative for dizziness and headaches.  Endo/Heme/Allergies:  Does not bruise/bleed easily.  Psychiatric/Behavioral:  Negative for depression. The patient is not  nervous/anxious.      Physical Exam BP 100/80   Pulse 81   Temp 97.9 F (36.6 C) (Temporal)   Ht '5\' 2"'$  (1.575 m)   Wt 198 lb 2 oz (89.9 kg)   SpO2 99%   BMI 36.24 kg/m    BP Readings from Last 3 Encounters:  06/30/21 100/80  03/11/20 118/79  01/27/20 110/80    Wt Readings from Last 3 Encounters:  06/30/21 198 lb 2 oz (89.9 kg)  03/11/20 199 lb 3.2 oz (90.4 kg)  01/27/20 200 lb 8 oz (90.9 kg)     Physical Exam Constitutional:      General: She is not in acute distress.    Appearance: She is well-developed. She is not diaphoretic.  HENT:     Head: Normocephalic and atraumatic.     Right Ear: External ear normal.     Left Ear: External ear normal.     Nose: Nose normal.  Eyes:     General: No scleral icterus.    Extraocular Movements: Extraocular movements intact.     Conjunctiva/sclera: Conjunctivae normal.  Cardiovascular:     Rate and Rhythm: Normal rate and regular rhythm.     Heart sounds: No murmur heard. Pulmonary:     Effort: Pulmonary effort is normal. No respiratory distress.     Breath sounds: Normal breath sounds. No wheezing.  Abdominal:     General: Bowel sounds are normal. There is no distension.     Palpations: Abdomen is soft. There is no mass.     Tenderness: There is no abdominal tenderness. There is no guarding or rebound.  Musculoskeletal:        General: Normal range of motion.     Cervical back: Neck supple.     Comments: Some thumb joint ttp Negative phalen, neg tinel at wrist and elbow  Lymphadenopathy:     Cervical: No cervical adenopathy.  Skin:    General: Skin is warm and dry.     Capillary Refill: Capillary refill takes less than 2 seconds.  Neurological:     Mental Status: She is alert and oriented to person, place, and time.     Deep Tendon Reflexes: Reflexes normal.  Psychiatric:        Mood and Affect: Mood normal.        Behavior: Behavior normal.       Results:    01/27/2020   10:10 AM 08/26/2019   12:12 PM   Depression screen PHQ 2/9  Decreased Interest 1 1  Down, Depressed, Hopeless 1 2  PHQ - 2 Score 2 3  Altered sleeping 1 1  Tired, decreased energy 1 2  Change in appetite 1 1  Feeling bad or failure about yourself  0 2  Trouble concentrating 1 2  Moving slowly or fidgety/restless 0 1  Suicidal thoughts 0 0  PHQ-9 Score 6 12  Difficult doing work/chores Not difficult at all Somewhat difficult      Assessment: 31 y.o. No obstetric history on file. female here for routine annual examination.  Plan: Problem List Items Addressed This Visit       Other   Right hand paresthesia    Advised trial of bracing at night for possible carpal tunnel. F/u with sports if no improvement.       Other Visit Diagnoses     Annual physical exam    -  Primary   Relevant Orders   CBC   Obesity (BMI 35.0-39.9 without comorbidity)       Relevant Orders   Comprehensive metabolic panel   Lipid panel   Amb Ref to Medical Weight Management   Mixed hyperlipidemia       Relevant Orders   Comprehensive metabolic panel   Lipid panel   Amb Ref to Medical Weight Management   Tubal ligation evaluation       Relevant Orders   Ambulatory referral to Obstetrics / Gynecology        Screening: --  Blood pressure screen normal -- cholesterol screening: will obtain -- Weight screening: obese: discussed management options, including lifestyle, dietary, and exercise. -- Diabetes Screening: will obtain -- Nutrition: encouraged healthy diet   Psych -- Depression screening (PHQ-9):  [place] Office Visit from 01/27/2020 in [place] at [facility]  PHQ-9 Total Score 6        Safety -- tobacco screening: not using -- alcohol screening:  low-risk usage. -- no evidence of domestic violence or intimate partner violence.   Cancer Screening -- pap smear not collected per ASCCP guidelines -- family history of breast cancer screening: done. not at high  risk.   Immunizations  There is no immunization history on file for this patient.  Reports getting Tdap 5 years ago  -- flu vaccine not in season -- TDAP q10 years pt reports 5 years ago  -- Covid-19 Vaccine declined  Encouraged regular vision and dental screening. Encouraged healthy exercise and diet.   Lesleigh Noe

## 2021-06-30 NOTE — Patient Instructions (Addendum)
Try a Carpal Tunnel brace at night If no improvement in 3-4 weeks return to see Dr. Lorelei Pont  #Referral I have placed a referral to a specialist for you. You should receive a phone call from the specialty office. Make sure your voicemail is not full and that if you are able to answer your phone to unknown or new numbers.   It may take up to 2 weeks to hear about the referral. If you do not hear anything in 2 weeks, please call our office and ask to speak with the referral coordinator.  - healthy weight and wellness

## 2021-06-30 NOTE — Assessment & Plan Note (Signed)
Advised trial of bracing at night for possible carpal tunnel. F/u with sports if no improvement.

## 2021-07-11 ENCOUNTER — Other Ambulatory Visit: Payer: Self-pay | Admitting: Family Medicine

## 2021-07-11 DIAGNOSIS — N6320 Unspecified lump in the left breast, unspecified quadrant: Secondary | ICD-10-CM

## 2021-08-26 ENCOUNTER — Other Ambulatory Visit: Payer: 59

## 2021-09-01 ENCOUNTER — Ambulatory Visit (INDEPENDENT_AMBULATORY_CARE_PROVIDER_SITE_OTHER): Payer: 59 | Admitting: Family Medicine

## 2021-09-01 VITALS — BP 106/80 | HR 74 | Temp 97.9°F | Ht 62.0 in | Wt 197.0 lb

## 2021-09-01 DIAGNOSIS — M25512 Pain in left shoulder: Secondary | ICD-10-CM | POA: Insufficient documentation

## 2021-09-01 MED ORDER — MELOXICAM 7.5 MG PO TABS: 7.5000 mg | ORAL_TABLET | Freq: Every day | ORAL | 0 refills | Status: DC

## 2021-09-01 NOTE — Patient Instructions (Addendum)
Home exercises  Meloxicam - take with prilosec if stomach upset - take daily for 2 weeks  If no improvement - see dr. Lorelei Pont or mychart for physical therapy referral

## 2021-09-01 NOTE — Progress Notes (Signed)
Subjective:     Caroline Patterson is a 31 y.o. female presenting for Shoulder Pain (L x 2 weeks, limited ROM and lifting.)     Shoulder Pain  The pain is present in the left shoulder and back. This is a new problem. The current episode started 1 to 4 weeks ago. There has been a history of trauma (picking something up from a high shelf). The problem has been gradually worsening. The quality of the pain is described as sharp (when lifting or moving quickly). The pain is mild. Associated symptoms include a limited range of motion. Pertinent negatives include no inability to bear weight, joint locking, joint swelling, numbness, stiffness or tingling. Exacerbated by: raising the arm. She has tried acetaminophen for the symptoms. The treatment provided mild relief.   Ibuprofen upsets her stomach   Review of Systems  Musculoskeletal:  Negative for stiffness.  Neurological:  Negative for tingling and numbness.     Social History   Tobacco Use  Smoking Status Never  Smokeless Tobacco Never        Objective:    BP Readings from Last 3 Encounters:  09/01/21 106/80  06/30/21 100/80  03/11/20 118/79   Wt Readings from Last 3 Encounters:  09/01/21 197 lb (89.4 kg)  06/30/21 198 lb 2 oz (89.9 kg)  03/11/20 199 lb 3.2 oz (90.4 kg)    BP 106/80   Pulse 74   Temp 97.9 F (36.6 C) (Temporal)   Ht '5\' 2"'$  (1.575 m)   Wt 197 lb (89.4 kg)   SpO2 99%   BMI 36.03 kg/m    Physical Exam Constitutional:      General: She is not in acute distress.    Appearance: She is well-developed. She is not diaphoretic.  HENT:     Right Ear: External ear normal.     Left Ear: External ear normal.     Nose: Nose normal.  Eyes:     Conjunctiva/sclera: Conjunctivae normal.  Cardiovascular:     Rate and Rhythm: Normal rate.  Pulmonary:     Effort: Pulmonary effort is normal.  Musculoskeletal:     Cervical back: Neck supple.     Comments: Left shoulder Inspection: no step offs or  erythema ROM: decreased passive ROM on the left, but normal full active ROM with pain 160-180 degrees with flexion and abduction. Normal internal/external rotation Strength: normal with some pain on the left Hawkins with pain Neer w/o  pain Rotator cuff with pain with anterior but strength normal  Skin:    General: Skin is warm and dry.     Capillary Refill: Capillary refill takes less than 2 seconds.  Neurological:     Mental Status: She is alert. Mental status is at baseline.  Psychiatric:        Mood and Affect: Mood normal.        Behavior: Behavior normal.           Assessment & Plan:   Problem List Items Addressed This Visit       Other   Acute pain of left shoulder - Primary    Ddx: impingement vs muscle strain vs rotator cuff injury without full tear. She did have pain with rotator cuff testing and some impingement signs positive while others negative. Discussed trial of NSAIDS and home exercises. Update if no improvement in 2 weeks to see Dr. Lorelei Pont or start PT.       Relevant Medications   meloxicam (MOBIC) 7.5 MG  tablet     Return if symptoms worsen or fail to improve.  Lesleigh Noe, MD

## 2021-09-01 NOTE — Assessment & Plan Note (Signed)
Ddx: impingement vs muscle strain vs rotator cuff injury without full tear. She did have pain with rotator cuff testing and some impingement signs positive while others negative. Discussed trial of NSAIDS and home exercises. Update if no improvement in 2 weeks to see Dr. Lorelei Pont or start PT.

## 2021-09-06 ENCOUNTER — Ambulatory Visit
Admission: RE | Admit: 2021-09-06 | Discharge: 2021-09-06 | Disposition: A | Discharge status: Home / Self Care | Payer: 59 | Source: Ambulatory Visit | Attending: Family Medicine | Admitting: Family Medicine

## 2021-09-06 DIAGNOSIS — N6320 Unspecified lump in the left breast, unspecified quadrant: Secondary | ICD-10-CM

## 2021-09-06 DIAGNOSIS — N6322 Unspecified lump in the left breast, upper inner quadrant: Secondary | ICD-10-CM | POA: Diagnosis not present

## 2021-09-06 DIAGNOSIS — N6324 Unspecified lump in the left breast, lower inner quadrant: Secondary | ICD-10-CM | POA: Diagnosis not present

## 2021-09-06 DIAGNOSIS — R69 Illness, unspecified: Secondary | ICD-10-CM | POA: Diagnosis not present

## 2021-09-14 DIAGNOSIS — Z3046 Encounter for surveillance of implantable subdermal contraceptive: Secondary | ICD-10-CM | POA: Diagnosis not present

## 2021-09-28 ENCOUNTER — Other Ambulatory Visit: Payer: Self-pay | Admitting: Family Medicine

## 2021-09-28 DIAGNOSIS — M25512 Pain in left shoulder: Secondary | ICD-10-CM

## 2021-10-04 DIAGNOSIS — R69 Illness, unspecified: Secondary | ICD-10-CM | POA: Diagnosis not present

## 2021-10-07 ENCOUNTER — Telehealth: Payer: Self-pay

## 2021-10-07 NOTE — Telephone Encounter (Signed)
Called pt to confirm appointment for tomorrow.  Vm full

## 2021-10-11 ENCOUNTER — Encounter: Payer: 59 | Admitting: Obstetrics and Gynecology

## 2021-10-18 DIAGNOSIS — R69 Illness, unspecified: Secondary | ICD-10-CM | POA: Diagnosis not present

## 2021-10-25 DIAGNOSIS — R69 Illness, unspecified: Secondary | ICD-10-CM | POA: Diagnosis not present

## 2021-11-01 DIAGNOSIS — R69 Illness, unspecified: Secondary | ICD-10-CM | POA: Diagnosis not present

## 2021-11-08 DIAGNOSIS — R69 Illness, unspecified: Secondary | ICD-10-CM | POA: Diagnosis not present

## 2021-11-15 DIAGNOSIS — R69 Illness, unspecified: Secondary | ICD-10-CM | POA: Diagnosis not present

## 2021-11-22 DIAGNOSIS — R69 Illness, unspecified: Secondary | ICD-10-CM | POA: Diagnosis not present

## 2021-11-29 DIAGNOSIS — R69 Illness, unspecified: Secondary | ICD-10-CM | POA: Diagnosis not present

## 2021-12-13 DIAGNOSIS — R69 Illness, unspecified: Secondary | ICD-10-CM | POA: Diagnosis not present

## 2021-12-20 DIAGNOSIS — R69 Illness, unspecified: Secondary | ICD-10-CM | POA: Diagnosis not present

## 2022-01-03 DIAGNOSIS — R69 Illness, unspecified: Secondary | ICD-10-CM | POA: Diagnosis not present

## 2022-01-06 ENCOUNTER — Ambulatory Visit: Payer: 59 | Admitting: Internal Medicine

## 2022-01-06 ENCOUNTER — Encounter: Payer: Self-pay | Admitting: Internal Medicine

## 2022-01-06 VITALS — BP 102/60 | HR 102 | Temp 98.1°F | Ht 62.0 in | Wt 187.0 lb

## 2022-01-06 DIAGNOSIS — J020 Streptococcal pharyngitis: Secondary | ICD-10-CM | POA: Diagnosis not present

## 2022-01-06 DIAGNOSIS — J029 Acute pharyngitis, unspecified: Secondary | ICD-10-CM

## 2022-01-06 LAB — POC COVID19 BINAXNOW: SARS Coronavirus 2 Ag: NEGATIVE

## 2022-01-06 LAB — POCT RAPID STREP A (OFFICE): Rapid Strep A Screen: POSITIVE — AB

## 2022-01-06 MED ORDER — PENICILLIN V POTASSIUM 500 MG PO TABS: 500.0000 mg | ORAL_TABLET | Freq: Three times a day (TID) | ORAL | 0 refills | Status: AC

## 2022-01-06 NOTE — Assessment & Plan Note (Addendum)
Discussed analgesics PCN veek 500 tid x 10 days Okay to work again tomorrow evening

## 2022-01-06 NOTE — Progress Notes (Signed)
Subjective:    Patient ID: Caroline Patterson, female    DOB: [birth year], 32 y.o.   MRN: [id number]  HPI Here due to sore throat  Started yesterday Low grade fever this morning No trouble swallowing No rhinorrhea or cough No ear pain  12 year old daughter Works in Chiropractor  Current Outpatient Medications on File Prior to Visit  Medication Sig Dispense Refill   meloxicam (MOBIC) 7.5 MG tablet Take 1 tablet (7.5 mg total) by mouth daily. 30 tablet 0   No current facility-administered medications on file prior to visit.    No Known Allergies  Past Medical History:  Diagnosis Date   Ankle fracture 12/2015   left    Past Surgical History:  Procedure Laterality Date   INGUINAL HERNIA REPAIR     ORIF ANKLE FRACTURE Left 12/30/2015   Procedure: OPEN REDUCTION INTERNAL FIXATION (ORIF) LEFT ANKLE SYNDESMOSIS;  Surgeon: Earlie Server, MD;  Location: [facility];  Service: Orthopedics;  Laterality: Left;   WRIST SURGERY     tendon repair    Family History  Problem Relation Age of Onset   Depression Mother    Diabetes Mother    High Cholesterol Mother    Miscarriages / [place] Mother    Alcohol abuse Father    Depression Father    Drug abuse Father    High Cholesterol Father    High blood pressure Father    Alcohol abuse Brother    Diabetes Maternal Grandmother    Heart attack Maternal Grandfather    Rheum arthritis Paternal Grandmother    Kidney disease Paternal Grandmother     Social History   Socioeconomic History   Marital status: Single    Spouse name: Not on file   Number of children: 1   Years of education: Not on file   Highest education level: Associate degree: academic program  Occupational History   Occupation: Librarian, academic  Tobacco Use   Smoking status: Never   Smokeless tobacco: Never  Vaping Use   Vaping Use: Never used  Substance and Sexual Activity   Alcohol use: Yes    Comment: 1-2 times a week, 1-2 drinks   Drug  use: No   Sexual activity: Yes    Partners: Male    Birth control/protection: Implant  Other Topics Concern   Not on file  Social History Narrative   08/26/19   From: the area   Living: with daughter   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, petroleum      Family: daughter - Vernie Shanks (2008)      Enjoys: Barrister's clerk, art projects, reading      Exercise: walking dogs at least once a day - walks about 2 miles   Diet: tries to eat healthy, veggies, water, monitor what she eats/drinks, avoids sweets      Safety   Seat belts: Yes    Guns: Yes  and secure   Safe in relationships: Yes    Social Determinants of Radio broadcast assistant Strain: Not on file  Food Insecurity: Not on file  Transportation Needs: Not on file  Physical Activity: Not on file  Stress: Not on file  Social Connections: Not on file  Intimate Partner Violence: Not on file   Review of Systems No N/V Eating okay Chronic headaches--no change    Objective:   Physical Exam Constitutional:      Appearance: Normal appearance.  HENT:     Right Ear: Tympanic membrane and ear canal normal.     Left Ear: Tympanic membrane and ear canal normal.     Mouth/Throat:     Comments: 3+ injected tonsils with white exudate No apparent abscess Neck:     Comments: Mildly tender small anterior cervical nodes Pulmonary:     Effort: Pulmonary effort is normal.     Breath sounds: Normal breath sounds. No wheezing or rales.  Neurological:     Mental Status: She is alert.            Assessment & Plan:

## 2022-01-10 DIAGNOSIS — R69 Illness, unspecified: Secondary | ICD-10-CM | POA: Diagnosis not present

## 2022-01-17 DIAGNOSIS — R69 Illness, unspecified: Secondary | ICD-10-CM | POA: Diagnosis not present

## 2022-01-31 DIAGNOSIS — R69 Illness, unspecified: Secondary | ICD-10-CM | POA: Diagnosis not present

## 2022-02-07 DIAGNOSIS — R69 Illness, unspecified: Secondary | ICD-10-CM | POA: Diagnosis not present

## 2022-02-21 DIAGNOSIS — F411 Generalized anxiety disorder: Secondary | ICD-10-CM | POA: Diagnosis not present

## 2022-03-14 DIAGNOSIS — F411 Generalized anxiety disorder: Secondary | ICD-10-CM | POA: Diagnosis not present

## 2022-03-21 DIAGNOSIS — F411 Generalized anxiety disorder: Secondary | ICD-10-CM | POA: Diagnosis not present

## 2022-03-28 DIAGNOSIS — F411 Generalized anxiety disorder: Secondary | ICD-10-CM | POA: Diagnosis not present

## 2022-04-04 DIAGNOSIS — F411 Generalized anxiety disorder: Secondary | ICD-10-CM | POA: Diagnosis not present

## 2022-04-11 DIAGNOSIS — F411 Generalized anxiety disorder: Secondary | ICD-10-CM | POA: Diagnosis not present

## 2022-04-18 DIAGNOSIS — F411 Generalized anxiety disorder: Secondary | ICD-10-CM | POA: Diagnosis not present

## 2022-04-25 DIAGNOSIS — F411 Generalized anxiety disorder: Secondary | ICD-10-CM | POA: Diagnosis not present

## 2022-05-09 DIAGNOSIS — F411 Generalized anxiety disorder: Secondary | ICD-10-CM | POA: Diagnosis not present

## 2022-05-16 DIAGNOSIS — F411 Generalized anxiety disorder: Secondary | ICD-10-CM | POA: Diagnosis not present

## 2022-05-30 DIAGNOSIS — F411 Generalized anxiety disorder: Secondary | ICD-10-CM | POA: Diagnosis not present

## 2022-06-20 DIAGNOSIS — F411 Generalized anxiety disorder: Secondary | ICD-10-CM | POA: Diagnosis not present

## 2022-06-27 DIAGNOSIS — F411 Generalized anxiety disorder: Secondary | ICD-10-CM | POA: Diagnosis not present

## 2022-07-03 DIAGNOSIS — Z113 Encounter for screening for infections with a predominantly sexual mode of transmission: Secondary | ICD-10-CM | POA: Diagnosis not present

## 2022-07-03 DIAGNOSIS — Z114 Encounter for screening for human immunodeficiency virus [HIV]: Secondary | ICD-10-CM | POA: Diagnosis not present

## 2022-07-04 ENCOUNTER — Ambulatory Visit (INDEPENDENT_AMBULATORY_CARE_PROVIDER_SITE_OTHER): Payer: 59 | Admitting: Family Medicine

## 2022-07-04 ENCOUNTER — Encounter: Payer: Self-pay | Admitting: Family Medicine

## 2022-07-04 VITALS — BP 100/70 | HR 64 | Temp 97.7°F | Ht 62.0 in | Wt 181.2 lb

## 2022-07-04 DIAGNOSIS — R202 Paresthesia of skin: Secondary | ICD-10-CM | POA: Diagnosis not present

## 2022-07-04 DIAGNOSIS — R5383 Other fatigue: Secondary | ICD-10-CM

## 2022-07-04 DIAGNOSIS — Z0001 Encounter for general adult medical examination with abnormal findings: Secondary | ICD-10-CM | POA: Diagnosis not present

## 2022-07-04 DIAGNOSIS — M25512 Pain in left shoulder: Secondary | ICD-10-CM | POA: Diagnosis not present

## 2022-07-04 DIAGNOSIS — F411 Generalized anxiety disorder: Secondary | ICD-10-CM

## 2022-07-04 DIAGNOSIS — Z131 Encounter for screening for diabetes mellitus: Secondary | ICD-10-CM | POA: Diagnosis not present

## 2022-07-04 DIAGNOSIS — Z0000 Encounter for general adult medical examination without abnormal findings: Secondary | ICD-10-CM

## 2022-07-04 DIAGNOSIS — F410 Panic disorder [episodic paroxysmal anxiety] without agoraphobia: Secondary | ICD-10-CM

## 2022-07-04 DIAGNOSIS — Z13220 Encounter for screening for lipoid disorders: Secondary | ICD-10-CM | POA: Diagnosis not present

## 2022-07-04 DIAGNOSIS — G43009 Migraine without aura, not intractable, without status migrainosus: Secondary | ICD-10-CM | POA: Diagnosis not present

## 2022-07-04 LAB — COMPREHENSIVE METABOLIC PANEL
ALT: 13 U/L (ref 0–35)
AST: 15 U/L (ref 0–37)
Albumin: 4.2 g/dL (ref 3.5–5.2)
Alkaline Phosphatase: 38 U/L — ABNORMAL LOW (ref 39–117)
BUN: 13 mg/dL (ref 6–23)
CO2: 25 mEq/L (ref 19–32)
Calcium: 9.4 mg/dL (ref 8.4–10.5)
Chloride: 105 mEq/L (ref 96–112)
Creatinine, Ser: 0.61 mg/dL (ref 0.40–1.20)
GFR: 116.72 mL/min (ref >60.00)
Glucose, Bld: 86 mg/dL (ref 70–99)
Potassium: 4 mEq/L (ref 3.5–5.1)
Sodium: 139 mEq/L (ref 135–145)
Total Bilirubin: 1 mg/dL (ref 0.2–1.2)
Total Protein: 7 g/dL (ref 6.0–8.3)

## 2022-07-04 LAB — CBC WITH DIFFERENTIAL/PLATELET
Basophils Absolute: 0 10*3/uL (ref 0.0–0.1)
Basophils Relative: 0.6 % (ref 0.0–3.0)
Eosinophils Absolute: 0.1 10*3/uL (ref 0.0–0.7)
Eosinophils Relative: 2.4 % (ref 0.0–5.0)
HCT: 41 % (ref 36.0–46.0)
Hemoglobin: 13.8 g/dL (ref 12.0–15.0)
Lymphocytes Relative: 36.7 % (ref 12.0–46.0)
Lymphs Abs: 1.9 10*3/uL (ref 0.7–4.0)
MCHC: 33.8 g/dL (ref 30.0–36.0)
MCV: 93.2 fl (ref 78.0–100.0)
Monocytes Absolute: 0.4 10*3/uL (ref 0.1–1.0)
Monocytes Relative: 7.7 % (ref 3.0–12.0)
Neutro Abs: 2.7 10*3/uL (ref 1.4–7.7)
Neutrophils Relative %: 52.6 % (ref 43.0–77.0)
Platelets: 207 10*3/uL (ref 150.0–400.0)
RBC: 4.4 Mil/uL (ref 3.87–5.11)
RDW: 12.6 % (ref 11.5–15.5)
WBC: 5.2 10*3/uL (ref 4.0–10.5)

## 2022-07-04 LAB — LIPID PANEL
Cholesterol: 159 mg/dL (ref 0–200)
HDL: 49.7 mg/dL (ref >39.00)
LDL Cholesterol: 89 mg/dL (ref 0–99)
NonHDL: 109.78
Total CHOL/HDL Ratio: 3
Triglycerides: 105 mg/dL (ref 0.0–149.0)
VLDL: 21 mg/dL (ref 0.0–40.0)

## 2022-07-04 LAB — T3, FREE: T3, Free: 3.1 pg/mL (ref 2.3–4.2)

## 2022-07-04 LAB — TSH: TSH: 2.41 u[IU]/mL (ref 0.35–5.50)

## 2022-07-04 LAB — VITAMIN B12: Vitamin B-12: 374 pg/mL (ref 211–911)

## 2022-07-04 LAB — HEMOGLOBIN A1C: Hgb A1c MFr Bld: 5 % (ref 4.6–6.5)

## 2022-07-04 LAB — VITAMIN D 25 HYDROXY (VIT D DEFICIENCY, FRACTURES): VITD: 34.33 ng/mL (ref 30.00–100.00)

## 2022-07-04 LAB — T4, FREE: Free T4: 0.73 ng/dL (ref 0.60–1.60)

## 2022-07-04 MED ORDER — MELOXICAM 7.5 MG PO TABS: 7.5000 mg | ORAL_TABLET | Freq: Every day | ORAL | 0 refills | Status: DC

## 2022-07-04 NOTE — Patient Instructions (Signed)
Please stop at the lab to have labs drawn.  

## 2022-07-04 NOTE — Assessment & Plan Note (Signed)
Chronic, well controlled.. using tylenol  prn. LAst migraine in last 6 months.

## 2022-07-04 NOTE — Assessment & Plan Note (Signed)
Chronic.. meloxicam helps 1-2 days a week... after using hand more.

## 2022-07-04 NOTE — Progress Notes (Signed)
Patient ID: [name], female    DOB: 1990-01-24, 32 y.o.   MRN: [id number]  This visit was conducted in person.  BP 100/70 (BP Location: Right Arm, Patient Position: Sitting, Cuff Size: Large)   Pulse 64   Temp 97.7 F (36.5 C) (Temporal)   Ht 5\' 2"  (1.575 m)   Wt 181 lb 4 oz (82.2 kg)   LMP 06/05/2022   SpO2 98%   BMI 33.15 kg/m    CC:  Chief Complaint  Patient presents with   Establish Care    Subjective:   HPI: Caroline Patterson is a 32 y.o. female presenting on 07/04/2022 for Establish Care  Previous PCP Caroline Patterson  Last CPX 06/30/21  Gyn: [facility]      Hx of GAD, and panic attacks.  Current active but no recent panic attacks.  Seeing therapist once weekly and meditates.   Common migraines... occurring every 6 months in last year.   Hx of surgery on right wrist for tendon "knot" issue. Now has numbness in  thumb and first finger.  Relevant past medical, surgical, family and social history reviewed and updated as indicated. Interim medical history since our last visit reviewed. Allergies and medications reviewed and updated. Outpatient Medications Prior to Visit  Medication Sig Dispense Refill   meloxicam (MOBIC) 7.5 MG tablet Take 1 tablet (7.5 mg total) by mouth daily. 30 tablet 0   No facility-administered medications prior to visit.     Per HPI unless specifically indicated in ROS section below Review of Systems  Constitutional:  Negative for fatigue, fever and unexpected weight change.  HENT:  Negative for congestion, ear pain, sinus pressure, sneezing, sore throat and trouble swallowing.   Eyes:  Negative for pain and itching.  Respiratory:  Negative for cough, shortness of breath and wheezing.   Cardiovascular:  Negative for chest pain, palpitations and leg swelling.  Gastrointestinal:  Negative for abdominal pain, blood in stool, constipation, diarrhea and nausea.  Genitourinary:  Negative for difficulty urinating, dysuria, hematuria,  menstrual problem and vaginal discharge.  Skin:  Negative for rash.  Neurological:  Negative for syncope, weakness, light-headedness, numbness and headaches.  Psychiatric/Behavioral:  Negative for confusion and dysphoric mood. The patient is not nervous/anxious.    Objective:  BP 100/70 (BP Location: Right Arm, Patient Position: Sitting, Cuff Size: Large)   Pulse 64   Temp 97.7 F (36.5 C) (Temporal)   Ht 5\' 2"  (1.575 m)   Wt 181 lb 4 oz (82.2 kg)   LMP 06/05/2022   SpO2 98%   BMI 33.15 kg/m   Wt Readings from Last 3 Encounters:  07/04/22 181 lb 4 oz (82.2 kg)  01/06/22 187 lb (84.8 kg)  09/01/21 197 lb (89.4 kg)      Physical Exam Vitals and nursing note reviewed.  Constitutional:      General: She is not in acute distress.    Appearance: Normal appearance. She is well-developed. She is obese. She is not ill-appearing or toxic-appearing.  HENT:     Head: Normocephalic.     Right Ear: Hearing, tympanic membrane, ear canal and external ear normal.     Left Ear: Hearing, tympanic membrane, ear canal and external ear normal.     Nose: Nose normal.  Eyes:     General: Lids are normal. Lids are everted, no foreign bodies appreciated.     Conjunctiva/sclera: Conjunctivae normal.     Pupils: Pupils are equal, round, and reactive to light.  Neck:  Thyroid: No thyroid mass or thyromegaly.     Vascular: No carotid bruit.     Trachea: Trachea normal.  Cardiovascular:     Rate and Rhythm: Normal rate and regular rhythm.     Heart sounds: Normal heart sounds, S1 normal and S2 normal. No murmur heard.    No gallop.  Pulmonary:     Effort: Pulmonary effort is normal. No respiratory distress.     Breath sounds: Normal breath sounds. No wheezing, rhonchi or rales.  Abdominal:     General: Bowel sounds are normal. There is no distension or abdominal bruit.     Palpations: Abdomen is soft. There is no fluid wave or mass.     Tenderness: There is no abdominal tenderness. There is no  guarding or rebound.     Hernia: No hernia is present.  Musculoskeletal:     Cervical back: Normal range of motion and neck supple.  Lymphadenopathy:     Cervical: No cervical adenopathy.  Skin:    General: Skin is warm and dry.     Findings: No rash.  Neurological:     Mental Status: She is alert.     Cranial Nerves: No cranial nerve deficit.     Sensory: No sensory deficit.  Psychiatric:        Mood and Affect: Mood is not anxious or depressed.        Speech: Speech normal.        Behavior: Behavior normal. Behavior is cooperative.        Judgment: Judgment normal.       Results for orders placed or performed in visit on 01/06/22  POC COVID-19  Result Value Ref Range   SARS Coronavirus 2 Ag Negative Negative  Rapid Strep A  Result Value Ref Range   Rapid Strep A Screen Positive (A) Negative    Assessment and Plan The patient's preventative maintenance and recommended screening tests for an annual wellness exam were reviewed in full today. Brought up to date unless services declined.  Counselled on the importance of diet, exercise, and its role in overall health and mortality. The patient's FH and SH was reviewed, including their home life, tobacco status, and drug and alcohol status.    Vaccine:  She reports TD 7-8 years ago. Plan repeat in 2026  Pap ASCUS in 2022.. last pap 7/1/ will get recods.  HIV: done.. [facility]. Hep C done  Colon cancer or breast cancer in family Routine general medical examination at a health care facility  Generalized anxiety disorder with panic attacks Assessment & Plan:  Chronic, well controlled.  Current active but no recent panic attacks.  Seeing therapist once weekly and meditates.   History of medicaiton but no needed now   Migraine without aura and without status migrainosus, not intractable Assessment & Plan: Chronic, well controlled.. using tylenol  prn. LAst migraine in last 6  months.   Right hand paresthesia Assessment & Plan:  Chronic.. meloxicam helps 1-2 days a week... after using hand more.   Acute pain of left shoulder -     Meloxicam; Take 1 tablet (7.5 mg total) by mouth daily.  Dispense: 30 tablet; Refill: 0  Screening cholesterol level -     Lipid panel -     Comprehensive metabolic panel  Screening for diabetes mellitus -     Hemoglobin A1c  Other fatigue -     CBC with Differential/Platelet -     Vitamin B12 -  TSH -     T4, free -     T3, free -     VITAMIN D 25 Hydroxy (Vit-D Deficiency, Fractures)    Return in about 1 year (around 07/04/2023) for anuual physical with fasting labs prior.   Kerby Nora, MD

## 2022-07-04 NOTE — Assessment & Plan Note (Signed)
Chronic, well controlled.  Current active but no recent panic attacks.  Seeing therapist once weekly and meditates.   History of medicaiton but no needed now

## 2022-07-11 DIAGNOSIS — F411 Generalized anxiety disorder: Secondary | ICD-10-CM | POA: Diagnosis not present

## 2022-07-18 DIAGNOSIS — F411 Generalized anxiety disorder: Secondary | ICD-10-CM | POA: Diagnosis not present

## 2022-07-25 DIAGNOSIS — F411 Generalized anxiety disorder: Secondary | ICD-10-CM | POA: Diagnosis not present

## 2022-08-08 DIAGNOSIS — F411 Generalized anxiety disorder: Secondary | ICD-10-CM | POA: Diagnosis not present

## 2022-08-15 DIAGNOSIS — F411 Generalized anxiety disorder: Secondary | ICD-10-CM | POA: Diagnosis not present

## 2022-08-22 DIAGNOSIS — F411 Generalized anxiety disorder: Secondary | ICD-10-CM | POA: Diagnosis not present

## 2022-08-29 DIAGNOSIS — F411 Generalized anxiety disorder: Secondary | ICD-10-CM | POA: Diagnosis not present

## 2022-09-12 DIAGNOSIS — F411 Generalized anxiety disorder: Secondary | ICD-10-CM | POA: Diagnosis not present

## 2022-09-26 DIAGNOSIS — F411 Generalized anxiety disorder: Secondary | ICD-10-CM | POA: Diagnosis not present

## 2022-10-03 DIAGNOSIS — F411 Generalized anxiety disorder: Secondary | ICD-10-CM | POA: Diagnosis not present

## 2022-10-17 DIAGNOSIS — F411 Generalized anxiety disorder: Secondary | ICD-10-CM | POA: Diagnosis not present

## 2022-10-24 DIAGNOSIS — F411 Generalized anxiety disorder: Secondary | ICD-10-CM | POA: Diagnosis not present

## 2022-10-31 DIAGNOSIS — F411 Generalized anxiety disorder: Secondary | ICD-10-CM | POA: Diagnosis not present

## 2022-11-07 DIAGNOSIS — F411 Generalized anxiety disorder: Secondary | ICD-10-CM | POA: Diagnosis not present

## 2022-11-14 DIAGNOSIS — F411 Generalized anxiety disorder: Secondary | ICD-10-CM | POA: Diagnosis not present

## 2022-11-18 IMAGING — US US BREAST BX W LOC DEV 1ST LESION IMG BX SPEC US GUIDE*L*
1 series · 12 of 12 positions shown · non-contrast
Comparison: Previous exam(s).
COMPARISON: Previous exam(s).

Addendum:
CLINICAL DATA: 29-year-old female presenting for biopsy of a left
breast mass.

EXAM:
ULTRASOUND GUIDED LEFT BREAST CORE NEEDLE BIOPSY

[Series 1: us breast bx w loc dev 1st lesion img bx spec us g · 0.07mm/px · 12 of 12 slices shown]
[im 1/12]
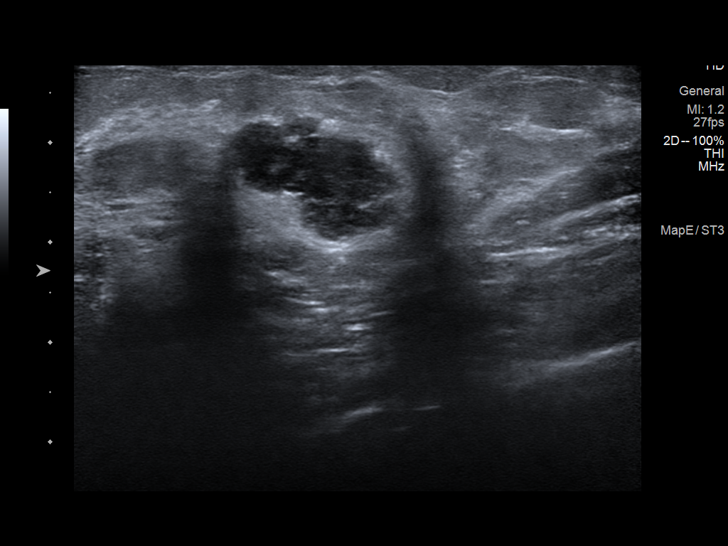
[im 2/12]
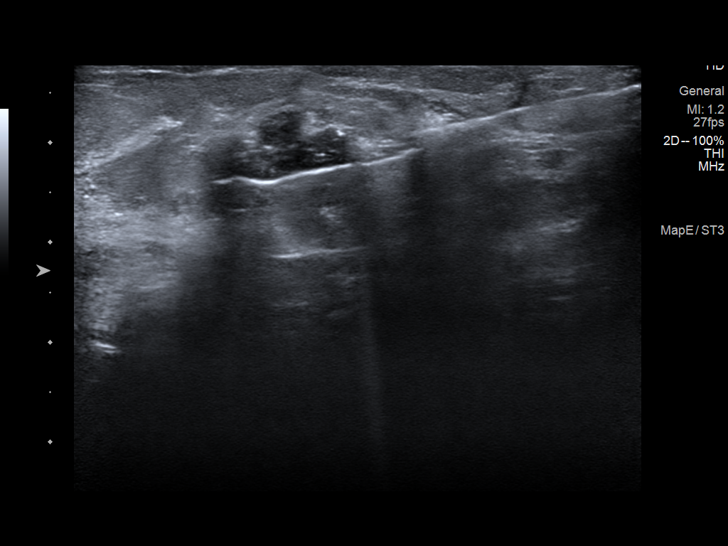
[im 3/12]
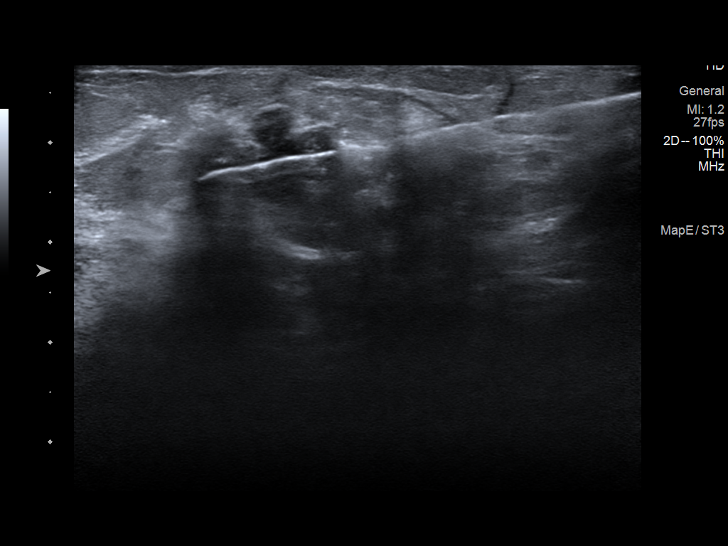
[im 4/12]
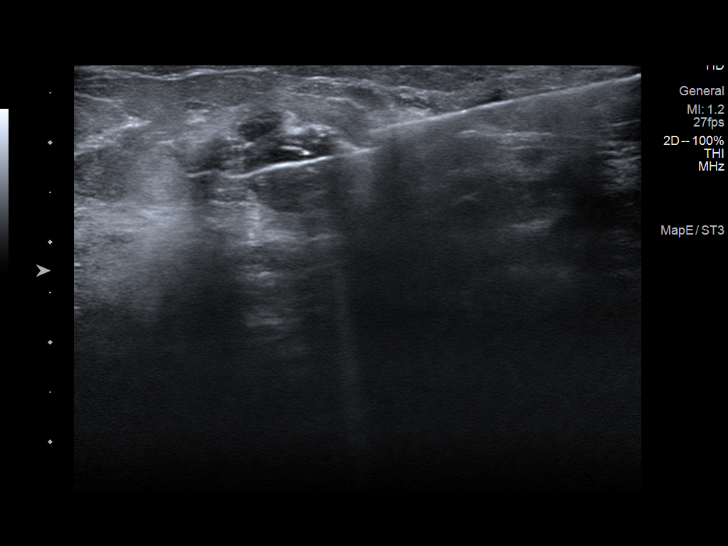
[im 5/12]
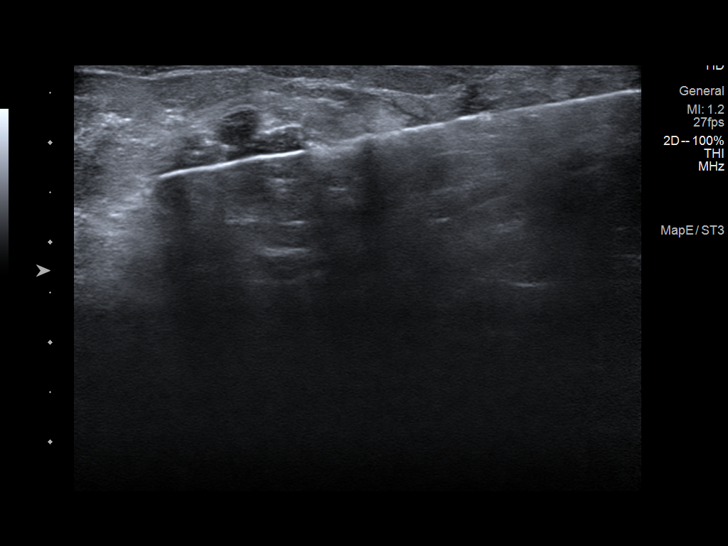
[im 6/12]
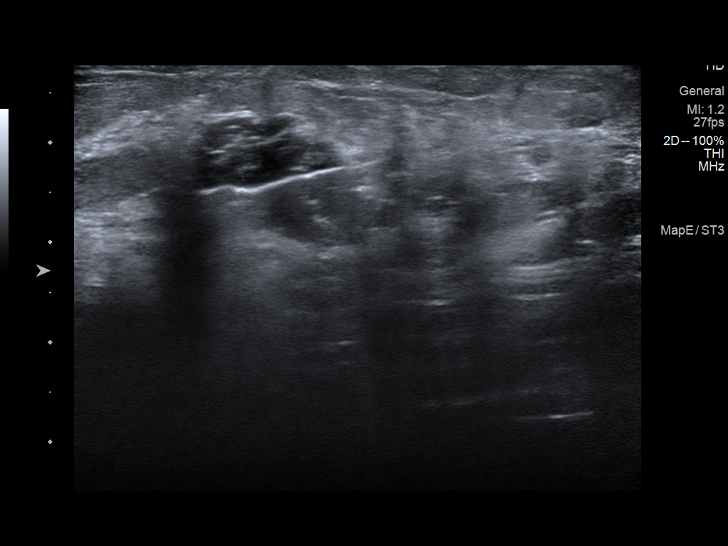
[im 7/12]
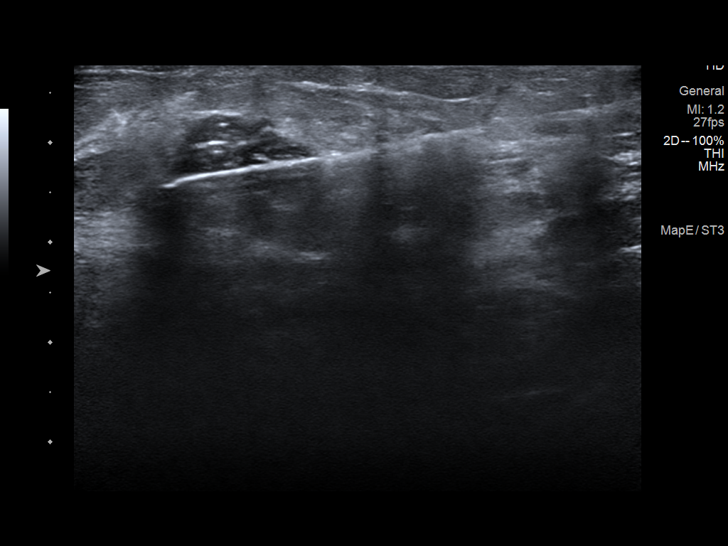
[im 8/12]
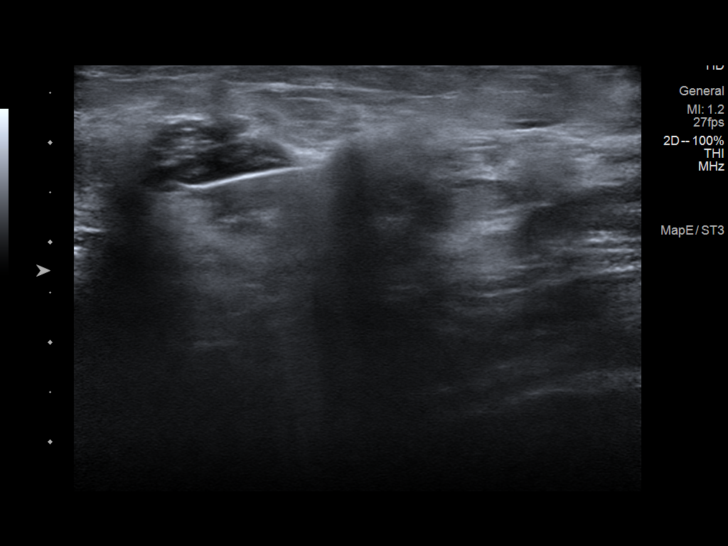
[im 9/12]
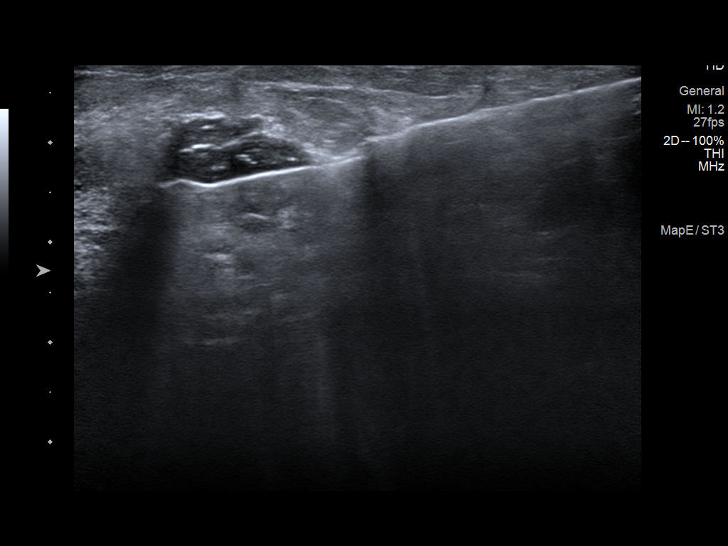
[im 10/12]
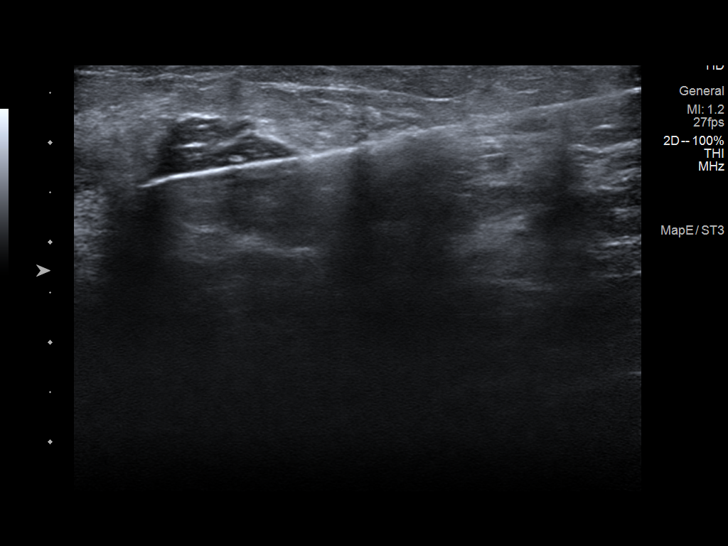
[im 11/12]
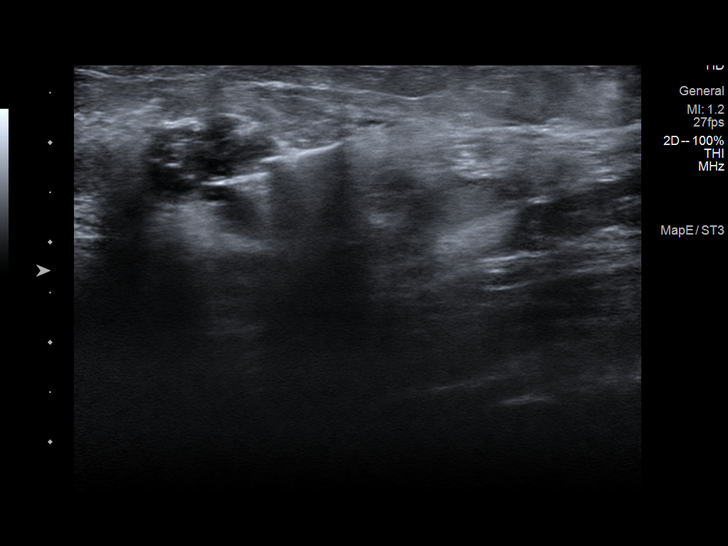
[im 12/12]
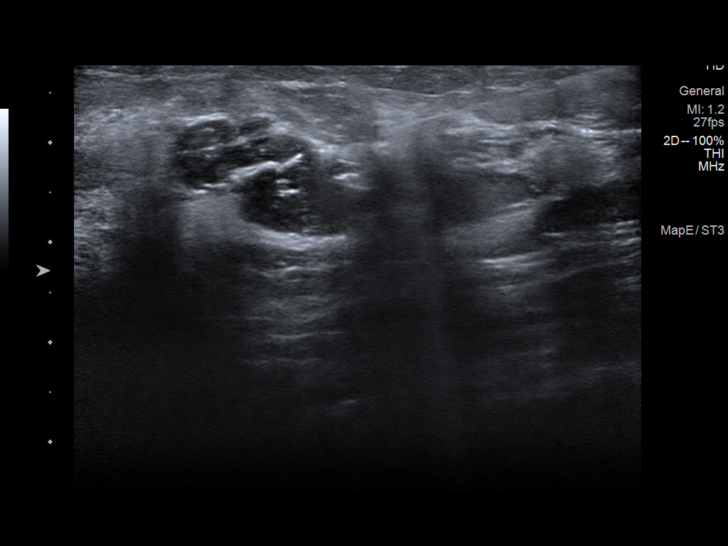

[12 of 12 positions shown; findings below may reference images not displayed]



Lesion quadrant: Upper outer quadrant

Using sterile technique and 1% Lidocaine as local anesthetic, under
direct ultrasound visualization, a 14 gauge Suzuki device was
used to perform biopsy of a left breast mass at 2:30 o'clock using a
lateral approach. At the conclusion of the procedure a ribbon tissue
marker clip was deployed into the biopsy cavity. Follow up 2 view
mammogram was performed and dictated separately.
IMPRESSION: Ultrasound guided biopsy of a left breast mass at 2:30 o'clock. No
apparent complications.

ADDENDUM:
Pathology revealed FIBROADENOMA of the LEFT breast, 2:30 o'clock.
This was found to be concordant by Dr. Gregori Fan.

Pathology results were discussed with the patient by telephone. The
patient reported doing well after the biopsy with tenderness at the
site. Post biopsy instructions and care were reviewed and questions
were answered. The patient was encouraged to call The [REDACTED]

The patient was asked to return for a left breast ultrasound in 6
months for another probably benign mass and informed a reminder
notice would be sent regarding this appointment.

Pathology results reported by Ama Sosa RN on 02/26/2020.



Lesion quadrant: Upper outer quadrant

Using sterile technique and 1% Lidocaine as local anesthetic, under
direct ultrasound visualization, a 14 gauge Suzuki device was
used to perform biopsy of a left breast mass at 2:30 o'clock using a
lateral approach. At the conclusion of the procedure a ribbon tissue
marker clip was deployed into the biopsy cavity. Follow up 2 view
mammogram was performed and dictated separately.
IMPRESSION: Ultrasound guided biopsy of a left breast mass at 2:30 o'clock. No
apparent complications.

## 2022-11-18 IMAGING — MG MM BREAST LOCALIZATION CLIP
4 series · 4 of 12 positions shown · non-contrast
Comparison: Previous exam(s).

CLINICAL DATA: Postprocedure mammogram for clip placement.

EXAM:
DIAGNOSTIC LEFT MAMMOGRAM POST ULTRASOUND BIOPSY

[L CC synth-2D]
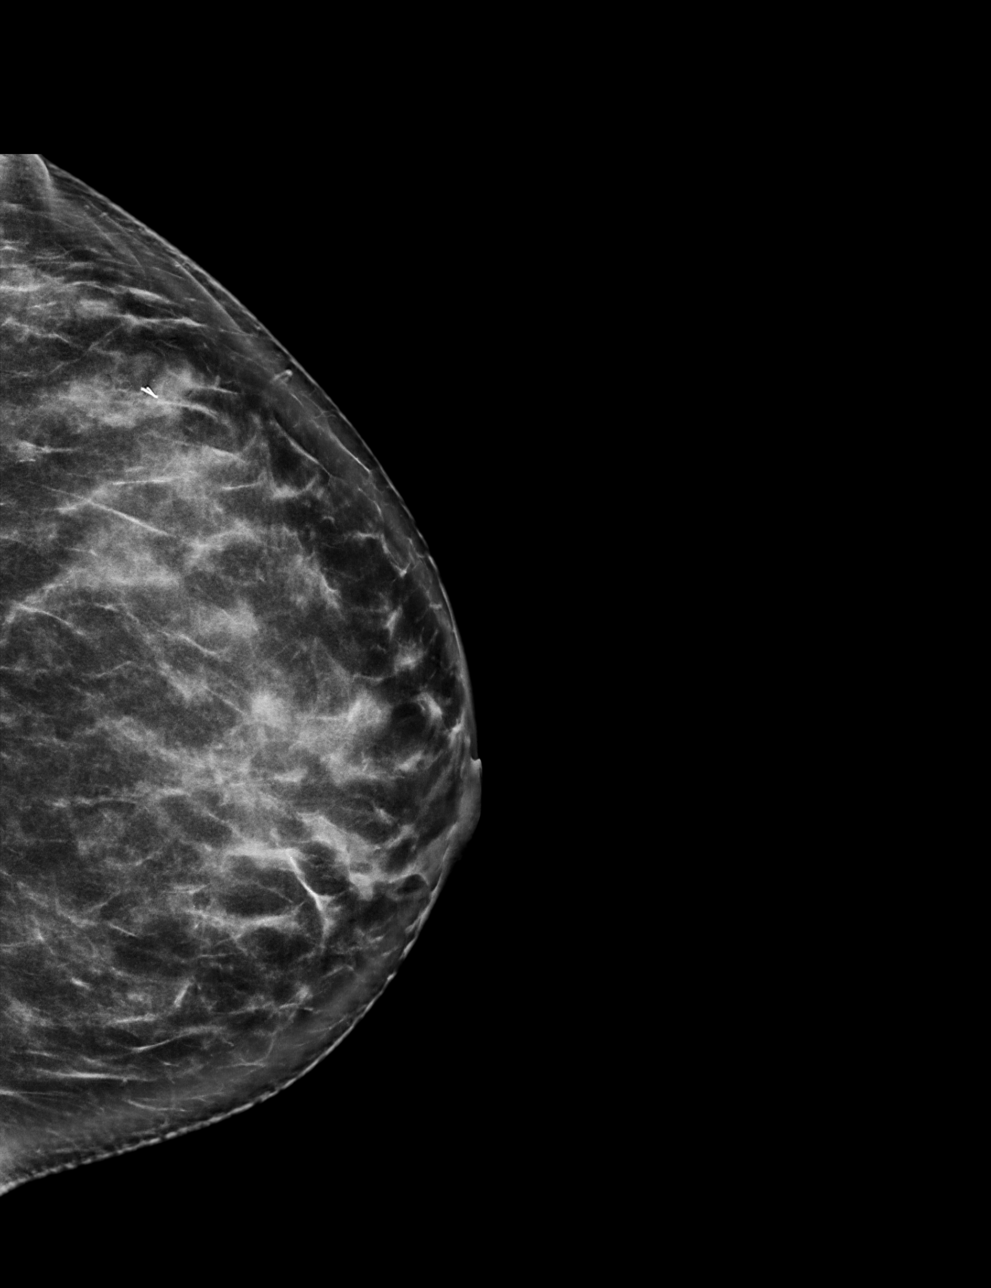

[L ML synth-2D]
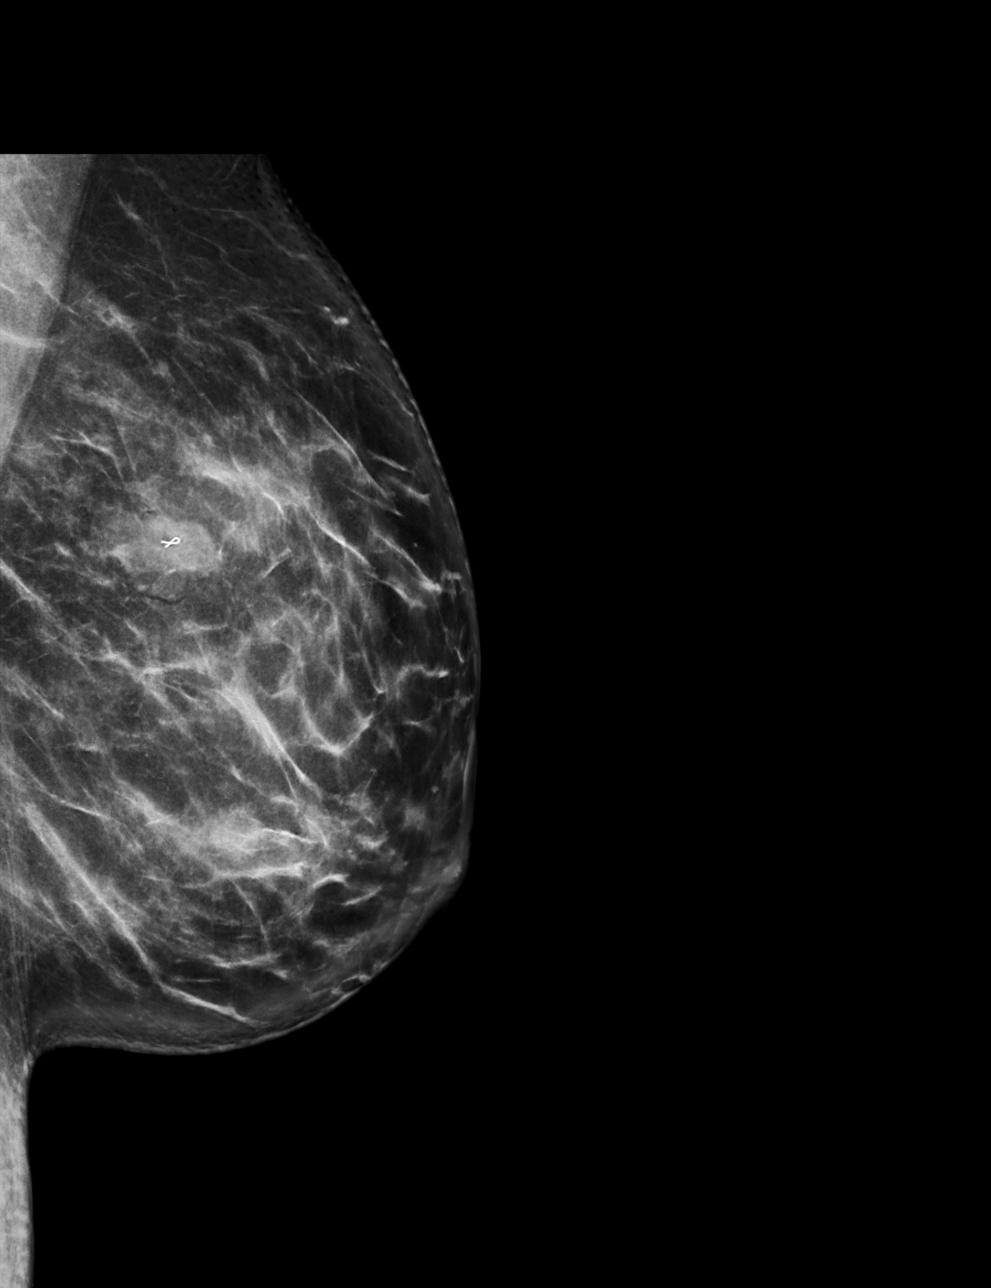

[L ML tomo · tomo slice 44/87.0]
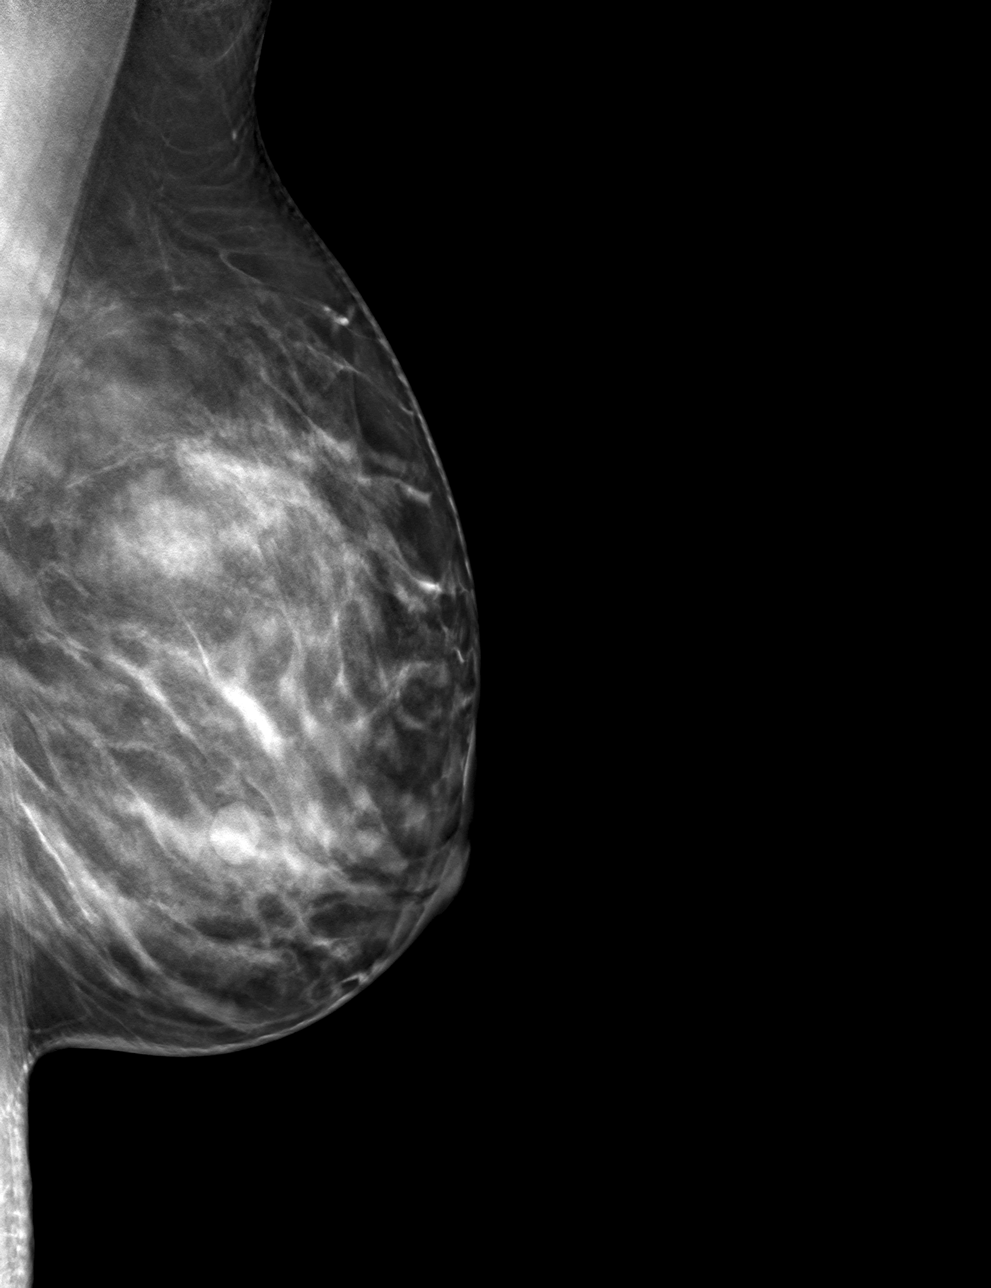

[L CC tomo · tomo slice 39/77.0]
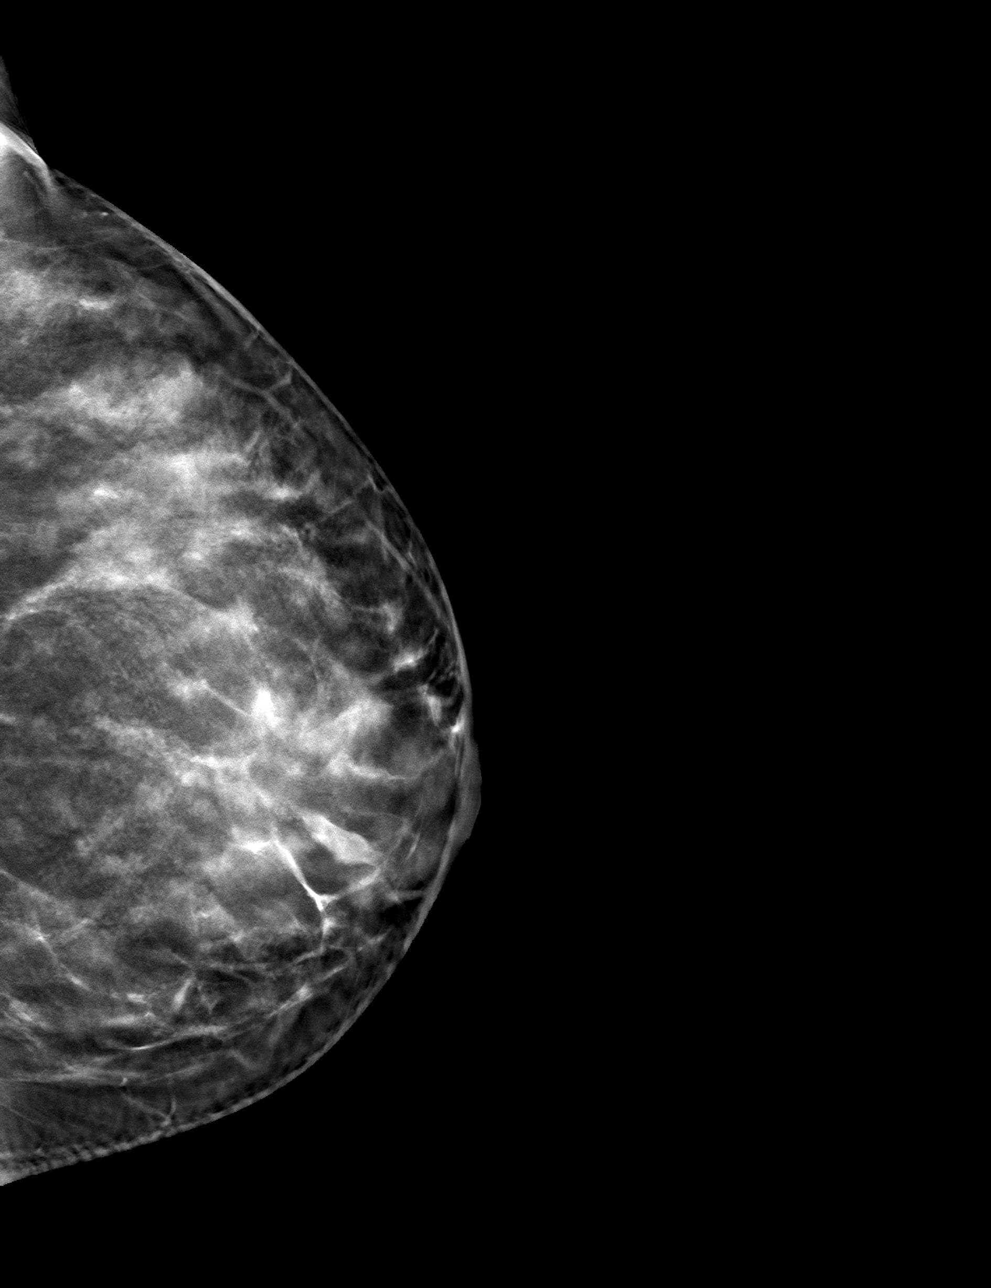

[4 of 12 positions shown; findings below may reference images not displayed]

FINDINGS: Mammographic images were obtained following ultrasound guided biopsy
of a left breast mass at 2:30 o'clock. The ribbon biopsy marking
clip is in expected position at the site of biopsy.
IMPRESSION: Appropriate positioning of the ribbon shaped biopsy marking clip at
the site of biopsy in the left breast at 2:30 o'clock.

Final Assessment: Post Procedure Mammograms for Marker Placement

## 2022-11-21 DIAGNOSIS — F411 Generalized anxiety disorder: Secondary | ICD-10-CM | POA: Diagnosis not present

## 2022-12-05 DIAGNOSIS — F411 Generalized anxiety disorder: Secondary | ICD-10-CM | POA: Diagnosis not present

## 2022-12-12 DIAGNOSIS — F411 Generalized anxiety disorder: Secondary | ICD-10-CM | POA: Diagnosis not present

## 2022-12-25 DIAGNOSIS — F411 Generalized anxiety disorder: Secondary | ICD-10-CM | POA: Diagnosis not present

## 2023-01-02 DIAGNOSIS — F411 Generalized anxiety disorder: Secondary | ICD-10-CM | POA: Diagnosis not present

## 2023-05-19 IMAGING — US US BREAST*L* LIMITED INC AXILLA
1 series · 5 of 5 positions shown · non-contrast
Comparison: Previous exams.

CLINICAL DATA: Short-term follow-up for probably benign mass in the
left breast at the 9 o'clock position. An additional mass in the
left breast at the [DATE] position was biopsied 02/26/2020 with
pathology revealing a fibroadenoma. The patient reports no problems
today.

EXAM:
ULTRASOUND OF THE LEFT BREAST

[Series 1: us breast*left* limited inc axilla · 0.06mm/px · 5 of 5 slices shown]
[im 1/5]
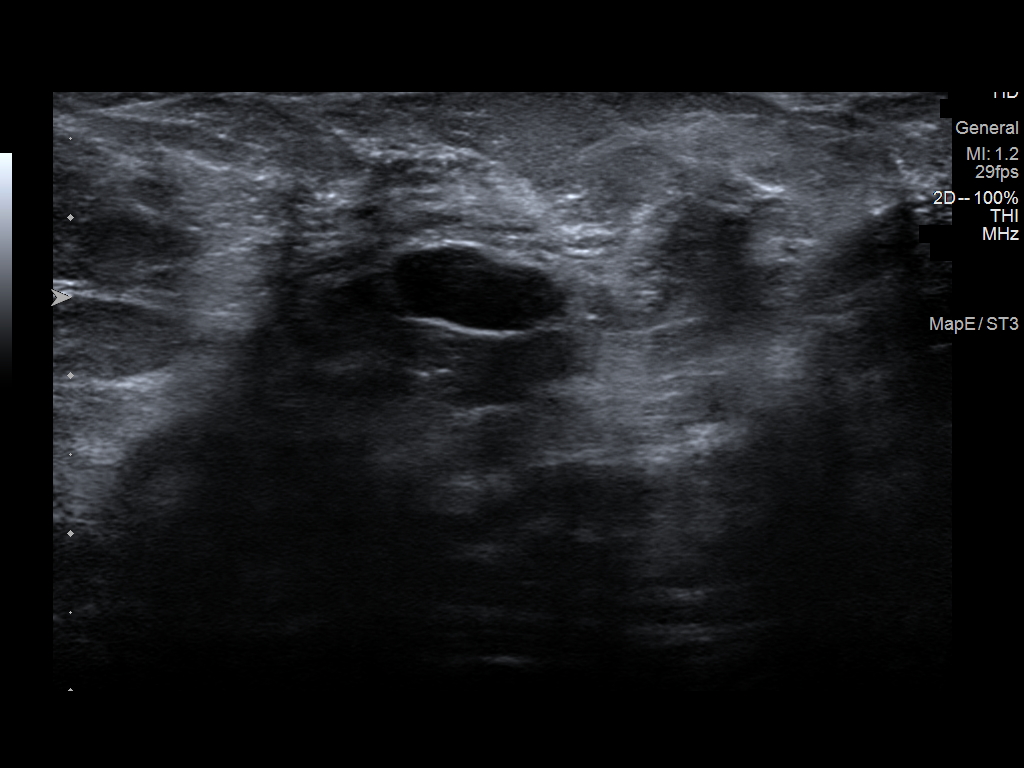
[im 2/5]
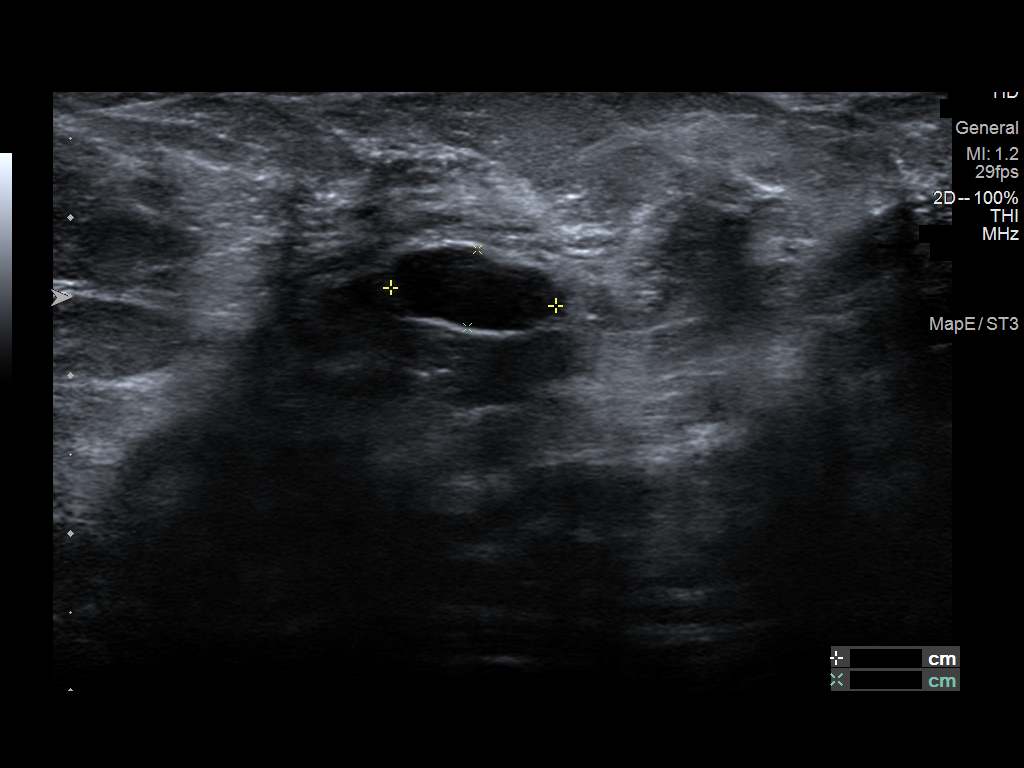
[im 3/5]
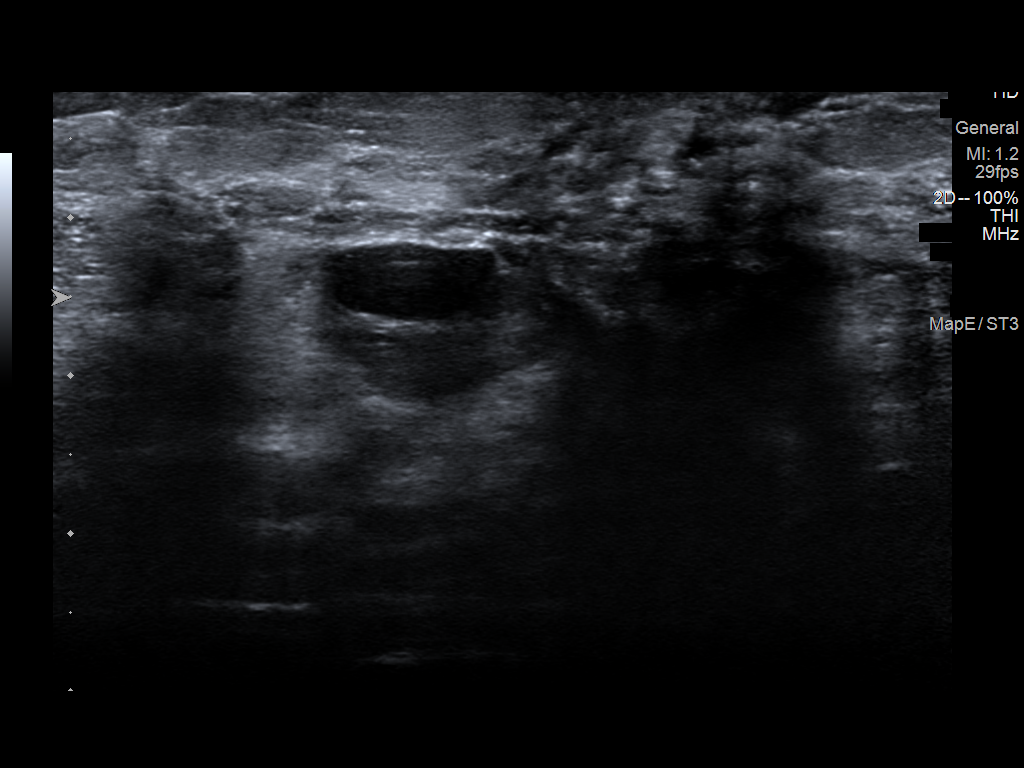
[im 4/5]
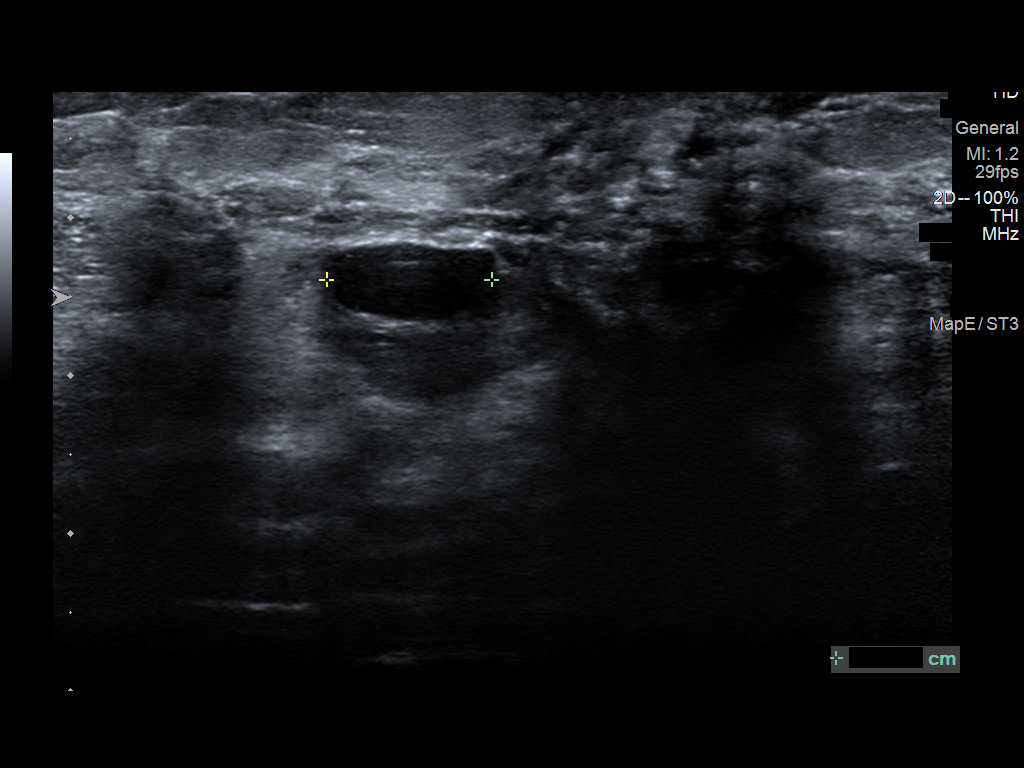
[im 5/5]
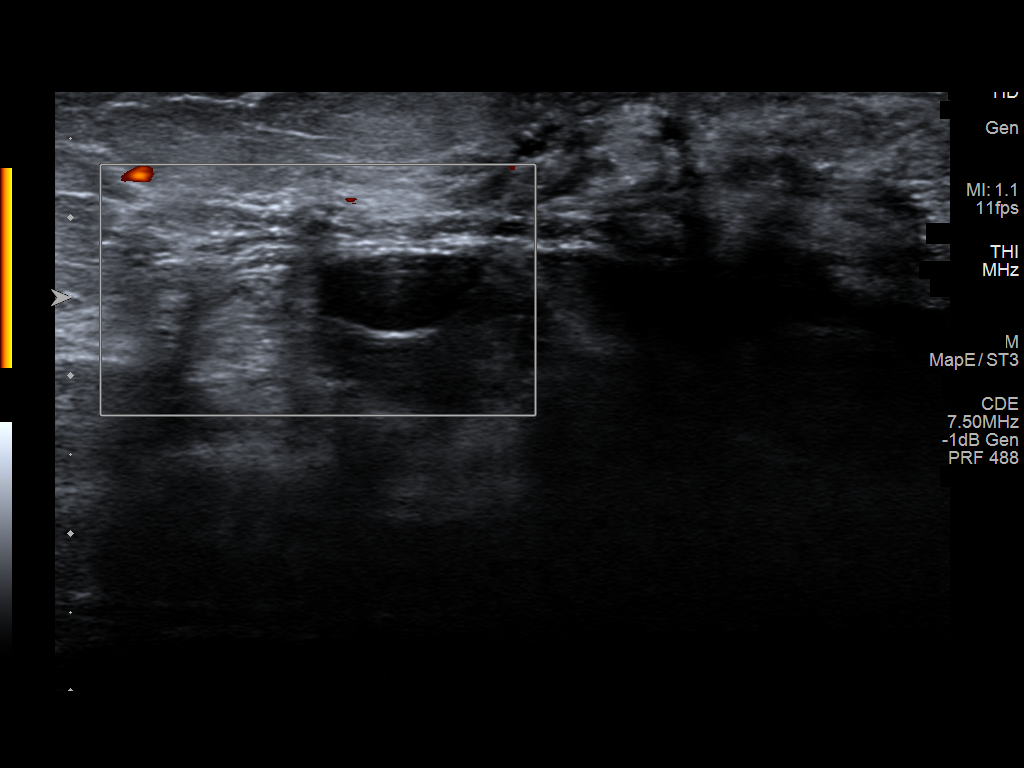

[5 of 5 positions shown; findings below may reference images not displayed]

FINDINGS: Targeted ultrasound of the left breast was performed. There is an
oval circumscribed hypoechoic mass in the left breast at 9 o'clock 1
cm from the nipple measuring 1.1 x 0.5 x 1 cm, unchanged in size and
appearance when compared to prior ultrasounds.
IMPRESSION: One year stability of probably benign mass in the left breast at the
9 o'clock position.

RECOMMENDATION:
Recommend left breast ultrasound in 1 year which will demonstrate 2
years of stability of the probably benign left breast mass.

I have discussed the findings and recommendations with the patient.
If applicable, a reminder letter will be sent to the patient
regarding the next appointment.

BI-RADS CATEGORY  3: Probably benign.

## 2023-07-04 ENCOUNTER — Encounter: Admitting: Family Medicine

## 2023-07-10 ENCOUNTER — Encounter: Admitting: Family Medicine

## 2023-07-17 ENCOUNTER — Encounter: Payer: Self-pay | Admitting: Family Medicine

## 2023-07-17 ENCOUNTER — Ambulatory Visit (INDEPENDENT_AMBULATORY_CARE_PROVIDER_SITE_OTHER): Admitting: Family Medicine

## 2023-07-17 VITALS — BP 100/70 | HR 66 | Temp 97.7°F | Ht 62.0 in | Wt 181.1 lb

## 2023-07-17 DIAGNOSIS — Z13220 Encounter for screening for lipoid disorders: Secondary | ICD-10-CM

## 2023-07-17 DIAGNOSIS — Z8349 Family history of other endocrine, nutritional and metabolic diseases: Secondary | ICD-10-CM | POA: Diagnosis not present

## 2023-07-17 DIAGNOSIS — Z0000 Encounter for general adult medical examination without abnormal findings: Secondary | ICD-10-CM | POA: Diagnosis not present

## 2023-07-17 NOTE — Progress Notes (Signed)
 Patient ID: Caroline Patterson, female    DOB: 1990/09/14, 33 y.o.   MRN: [id number]  This visit was conducted in person.  BP 100/70   Pulse 66   Temp 97.7 F (36.5 C) (Temporal)   Ht 5' 2 (1.575 m)   Wt 181 lb 2 oz (82.2 kg)   LMP 03/16/2023   SpO2 99%   BMI 33.13 kg/m    CC:  Chief Complaint  Patient presents with   Annual Exam    Subjective:   HPI: Caroline Patterson is a 33 y.o. female presenting on 07/17/2023 for Annual Exam  The patient presents for complete physical and review of chronic health problems. He/She also has the following acute concerns today: none   Due for re-eval labs.  Family history of thyroid  issues.   Hx of GAD, and panic attacks.  Current active but no recent panic attacks.  Seeing therapist once weekly and meditates.   Common migraines... occurring every 6 months in last year.   Hx of surgery on right wrist for tendon knot issue. Now has numbness in  thumb and first finger.   Diet: heart healthy diet.  Exercise: GYM three days week  Wt Readings from Last 3 Encounters:  07/17/23 181 lb 2 oz (82.2 kg)  07/04/22 181 lb 4 oz (82.2 kg)  01/06/22 187 lb (84.8 kg)     Relevant past medical, surgical, family and social history reviewed and updated as indicated. Interim medical history since our last visit reviewed. Allergies and medications reviewed and updated. Outpatient Medications Prior to Visit  Medication Sig Dispense Refill   etonogestrel-ethinyl estradiol (NUVARING) 0.12-0.015 MG/24HR vaginal ring Place 1 each vaginally every 28 (twenty-eight) days.     meloxicam  (MOBIC ) 7.5 MG tablet Take 1 tablet (7.5 mg total) by mouth daily. 30 tablet 0   No facility-administered medications prior to visit.     Per HPI unless specifically indicated in ROS section below Review of Systems  Constitutional:  Negative for fatigue, fever and unexpected weight change.  HENT:  Negative for congestion, ear pain, sinus pressure, sneezing, sore  throat and trouble swallowing.   Eyes:  Negative for pain and itching.  Respiratory:  Negative for cough, shortness of breath and wheezing.   Cardiovascular:  Negative for chest pain, palpitations and leg swelling.  Gastrointestinal:  Negative for abdominal pain, blood in stool, constipation, diarrhea and nausea.  Genitourinary:  Negative for difficulty urinating, dysuria, hematuria, menstrual problem and vaginal discharge.  Skin:  Negative for rash.  Neurological:  Negative for syncope, weakness, light-headedness, numbness and headaches.  Psychiatric/Behavioral:  Negative for confusion and dysphoric mood. The patient is not nervous/anxious.    Objective:  BP 100/70   Pulse 66   Temp 97.7 F (36.5 C) (Temporal)   Ht 5' 2 (1.575 m)   Wt 181 lb 2 oz (82.2 kg)   LMP 03/16/2023   SpO2 99%   BMI 33.13 kg/m   Wt Readings from Last 3 Encounters:  07/17/23 181 lb 2 oz (82.2 kg)  07/04/22 181 lb 4 oz (82.2 kg)  01/06/22 187 lb (84.8 kg)      Physical Exam Vitals and nursing note reviewed.  Constitutional:      General: She is not in acute distress.    Appearance: Normal appearance. She is well-developed. She is obese. She is not ill-appearing or toxic-appearing.  HENT:     Head: Normocephalic.     Right Ear: Hearing, tympanic membrane, ear canal and  external ear normal.     Left Ear: Hearing, tympanic membrane, ear canal and external ear normal.     Nose: Nose normal.  Eyes:     General: Lids are normal. Lids are everted, no foreign bodies appreciated.     Conjunctiva/sclera: Conjunctivae normal.     Pupils: Pupils are equal, round, and reactive to light.  Neck:     Thyroid : No thyroid  mass or thyromegaly.     Vascular: No carotid bruit.     Trachea: Trachea normal.  Cardiovascular:     Rate and Rhythm: Normal rate and regular rhythm.     Heart sounds: Normal heart sounds, S1 normal and S2 normal. No murmur heard.    No gallop.  Pulmonary:     Effort: Pulmonary effort is  normal. No respiratory distress.     Breath sounds: Normal breath sounds. No wheezing, rhonchi or rales.  Abdominal:     General: Bowel sounds are normal. There is no distension or abdominal bruit.     Palpations: Abdomen is soft. There is no fluid wave or mass.     Tenderness: There is no abdominal tenderness. There is no guarding or rebound.     Hernia: No hernia is present.  Musculoskeletal:     Cervical back: Normal range of motion and neck supple.  Lymphadenopathy:     Cervical: No cervical adenopathy.  Skin:    General: Skin is warm and dry.     Findings: No rash.  Neurological:     Mental Status: She is alert.     Cranial Nerves: No cranial nerve deficit.     Sensory: No sensory deficit.  Psychiatric:        Mood and Affect: Mood is not anxious or depressed.        Speech: Speech normal.        Behavior: Behavior normal. Behavior is cooperative.        Judgment: Judgment normal.       Results for orders placed or performed in visit on 07/04/22  Lipid panel   Collection Time: 07/04/22  9:05 AM  Result Value Ref Range   Cholesterol 159 0 - 200 mg/dL   Triglycerides 894.9 0.0 - 149.0 mg/dL   HDL 50.29 >60.99 mg/dL   VLDL 78.9 0.0 - 59.9 mg/dL   LDL Cholesterol 89 0 - 99 mg/dL   Total CHOL/HDL Ratio 3    NonHDL 109.78   Comprehensive metabolic panel   Collection Time: 07/04/22  9:05 AM  Result Value Ref Range   Sodium 139 135 - 145 mEq/L   Potassium 4.0 3.5 - 5.1 mEq/L   Chloride 105 96 - 112 mEq/L   CO2 25 19 - 32 mEq/L   Glucose, Bld 86 70 - 99 mg/dL   BUN 13 6 - 23 mg/dL   Creatinine, Ser 9.38 0.40 - 1.20 mg/dL   Total Bilirubin 1.0 0.2 - 1.2 mg/dL   Alkaline Phosphatase 38 (L) 39 - 117 U/L   AST 15 0 - 37 U/L   ALT 13 0 - 35 U/L   Total Protein 7.0 6.0 - 8.3 g/dL   Albumin 4.2 3.5 - 5.2 g/dL   GFR 883.27 >39.99 mL/min   Calcium 9.4 8.4 - 10.5 mg/dL  Hemoglobin J8r   Collection Time: 07/04/22  9:05 AM  Result Value Ref Range   Hgb A1c MFr Bld 5.0 4.6 -  6.5 %  CBC with Differential/Platelet   Collection Time: 07/04/22  9:05 AM  Result Value Ref Range   WBC 5.2 4.0 - 10.5 K/uL   RBC 4.40 3.87 - 5.11 Mil/uL   Hemoglobin 13.8 12.0 - 15.0 g/dL   HCT 58.9 63.9 - 53.9 %   MCV 93.2 78.0 - 100.0 fl   MCHC 33.8 30.0 - 36.0 g/dL   RDW 87.3 88.4 - 84.4 %   Platelets 207.0 150.0 - 400.0 K/uL   Neutrophils Relative % 52.6 43.0 - 77.0 %   Lymphocytes Relative 36.7 12.0 - 46.0 %   Monocytes Relative 7.7 3.0 - 12.0 %   Eosinophils Relative 2.4 0.0 - 5.0 %   Basophils Relative 0.6 0.0 - 3.0 %   Neutro Abs 2.7 1.4 - 7.7 K/uL   Lymphs Abs 1.9 0.7 - 4.0 K/uL   Monocytes Absolute 0.4 0.1 - 1.0 K/uL   Eosinophils Absolute 0.1 0.0 - 0.7 K/uL   Basophils Absolute 0.0 0.0 - 0.1 K/uL  Vitamin B12   Collection Time: 07/04/22  9:05 AM  Result Value Ref Range   Vitamin B-12 374 211 - 911 pg/mL  TSH   Collection Time: 07/04/22  9:05 AM  Result Value Ref Range   TSH 2.41 0.35 - 5.50 uIU/mL  T4, free   Collection Time: 07/04/22  9:05 AM  Result Value Ref Range   Free T4 0.73 0.60 - 1.60 ng/dL  T3, free   Collection Time: 07/04/22  9:05 AM  Result Value Ref Range   T3, Free 3.1 2.3 - 4.2 pg/mL  VITAMIN D  25 Hydroxy (Vit-D Deficiency, Fractures)   Collection Time: 07/04/22  9:05 AM  Result Value Ref Range   VITD 34.33 30.00 - 100.00 ng/mL    Assessment and Plan The patient's preventative maintenance and recommended screening tests for an annual wellness exam were reviewed in full today. Brought up to date unless services declined.  Counselled on the importance of diet, exercise, and its role in overall health and mortality. The patient's FH and SH was reviewed, including their home life, tobacco status, and drug and alcohol status.    Vaccine:  She reports TD 7-8 years ago. Plan repeat in 2026  Pap ASCUS in 2022, at repeat in GYN wnl, on q 3 year schedule.  Mammo: no early family history breast cancer  [facility] Hep C done Colon cancer or breast cancer in family Routine general medical examination at a health care facility  Family history of thyroid  disease -     TSH  Screening cholesterol level -     Comprehensive metabolic panel with GFR -     Lipid panel     Return in about 1 year (around 07/16/2024) for annual physical with fasting labs prior.   Greig Ring, MD

## 2023-07-18 ENCOUNTER — Other Ambulatory Visit

## 2023-07-19 ENCOUNTER — Ambulatory Visit: Payer: Self-pay | Admitting: Family Medicine

## 2023-07-19 LAB — COMPREHENSIVE METABOLIC PANEL WITH GFR
ALT: 14 U/L (ref 0–35)
AST: 16 U/L (ref 0–37)
Albumin: 4.1 g/dL (ref 3.5–5.2)
Alkaline Phosphatase: 39 U/L (ref 39–117)
BUN: 13 mg/dL (ref 6–23)
CO2: 26 meq/L (ref 19–32)
Calcium: 9.2 mg/dL (ref 8.4–10.5)
Chloride: 104 meq/L (ref 96–112)
Creatinine, Ser: 0.77 mg/dL (ref 0.40–1.20)
GFR: 99.98 mL/min (ref >60.00)
Glucose, Bld: 83 mg/dL (ref 70–99)
Potassium: 3.8 meq/L (ref 3.5–5.1)
Sodium: 138 meq/L (ref 135–145)
Total Bilirubin: 0.7 mg/dL (ref 0.2–1.2)
Total Protein: 6.8 g/dL (ref 6.0–8.3)

## 2023-07-19 LAB — LIPID PANEL
Cholesterol: 155 mg/dL (ref 0–200)
HDL: 60.7 mg/dL (ref >39.00)
LDL Cholesterol: 68 mg/dL (ref 0–99)
NonHDL: 93.88
Total CHOL/HDL Ratio: 3
Triglycerides: 129 mg/dL (ref 0.0–149.0)
VLDL: 25.8 mg/dL (ref 0.0–40.0)

## 2023-07-19 LAB — TSH: TSH: 1.22 u[IU]/mL (ref 0.35–5.50)

## 2024-01-14 ENCOUNTER — Ambulatory Visit: Payer: Self-pay

## 2024-01-14 NOTE — Telephone Encounter (Signed)
 Next Appt With Family Medicine Darra Ring, MD) 01/17/2024 at 11:00 AM

## 2024-01-14 NOTE — Telephone Encounter (Signed)
 FYI Only or Action Required?: FYI only for provider: appointment scheduled on 1/15.  Patient was last seen in primary care on 07/17/2023 by Avelina Greig BRAVO, MD.  Called Nurse Triage reporting Vaginal Bleeding.  Symptoms began several months ago.  Interventions attempted: Nothing.  Symptoms are: stable.  Triage Disposition: See PCP Within 2 Weeks  Patient/caregiver understands and will follow disposition?: Yes  Copied from CRM [id number]. Topic: Clinical - Red Word Triage >> Jan 14, 2024 10:55 AM Rea ORN wrote: Red Word that prompted transfer to Nurse Triage: Pt has had bleeding with intercourse Reason for Disposition  Bleeding or spotting occurs after sex  (Exception: First intercourse.)  Answer Assessment - Initial Assessment Questions 1. BLEEDING SEVERITY: Describe the bleeding that you are having. How much bleeding is there?      Enough to cause some worry, reports she had to wear a panty liner on some occasions after intercourse  2. ONSET: When did the bleeding begin? Is it continuing now?     3 months, no bleeding at this time, only notices it after intercourse and it stops shortly thereafter  3. MENSTRUAL PERIOD: When was the last normal menstrual period? How is this different than your period?     LMP Feb 2025  4. REGULARITY: How regular are your periods?     Irregular, on Nuva Ring  5. ABDOMEN PAIN: Do you have any pain? How bad is the pain?  (e.g., Scale 0-10; none, mild, moderate, or severe)     Denies  6. PREGNANCY: Is there any chance you are pregnant? When was your last menstrual period?     No  7. BREASTFEEDING: Are you breastfeeding?     Denies  8. HORMONE MEDICINES: Are you taking any hormone medicines, prescription or over-the-counter? (e.g., birth control pills, estrogen)     Nuva Ring birth control  9. BLOOD THINNER MEDICINES: Do you take any blood thinners? (e.g., Coumadin / warfarin, Pradaxa / dabigatran, aspirin)      Denies  10. CAUSE: What do you think is causing the bleeding? (e.g., recent gyn surgery, recent gyn procedure; known bleeding disorder, cervical cancer, polycystic ovarian disease, fibroids)         Pt unsure, reports this happened 2 years ago, was seen by [facility] and told it is a lubrication issue  11. HEMODYNAMIC STATUS: Are you weak or feeling lightheaded? If Yes, ask: Can you stand and walk normally?        Denies  12. OTHER SYMPTOMS: What other symptoms are you having with the bleeding? (e.g., passed tissue, vaginal discharge, fever, menstrual-type cramps)       Denies  Protocols used: Vaginal Bleeding - Abnormal-A-AH

## 2024-01-17 ENCOUNTER — Encounter: Payer: Self-pay | Admitting: Family Medicine

## 2024-01-17 ENCOUNTER — Other Ambulatory Visit (HOSPITAL_COMMUNITY)
Admission: RE | Admit: 2024-01-17 | Discharge: 2024-01-17 | Disposition: A | Source: Ambulatory Visit | Attending: Family Medicine | Admitting: Family Medicine

## 2024-01-17 ENCOUNTER — Ambulatory Visit: Admitting: Family Medicine

## 2024-01-17 VITALS — BP 110/80 | HR 109 | Temp 98.6°F | Ht 62.0 in | Wt 187.1 lb

## 2024-01-17 DIAGNOSIS — Z124 Encounter for screening for malignant neoplasm of cervix: Secondary | ICD-10-CM

## 2024-01-17 DIAGNOSIS — N930 Postcoital and contact bleeding: Secondary | ICD-10-CM | POA: Insufficient documentation

## 2024-01-17 DIAGNOSIS — Z113 Encounter for screening for infections with a predominantly sexual mode of transmission: Secondary | ICD-10-CM | POA: Insufficient documentation

## 2024-01-17 LAB — POCT URINE PREGNANCY: Preg Test, Ur: NEGATIVE

## 2024-01-17 LAB — CBC WITH DIFFERENTIAL/PLATELET
Basophils Absolute: 0 K/uL (ref 0.0–0.1)
Basophils Relative: 0.6 % (ref 0.0–3.0)
Eosinophils Absolute: 0.1 K/uL (ref 0.0–0.7)
Eosinophils Relative: 3 % (ref 0.0–5.0)
HCT: 40.6 % (ref 36.0–46.0)
Hemoglobin: 14.3 g/dL (ref 12.0–15.0)
Lymphocytes Relative: 37.6 % (ref 12.0–46.0)
Lymphs Abs: 1.7 K/uL (ref 0.7–4.0)
MCHC: 35.3 g/dL (ref 30.0–36.0)
MCV: 90.5 fl (ref 78.0–100.0)
Monocytes Absolute: 0.4 K/uL (ref 0.1–1.0)
Monocytes Relative: 8.7 % (ref 3.0–12.0)
Neutro Abs: 2.2 K/uL (ref 1.4–7.7)
Neutrophils Relative %: 50.1 % (ref 43.0–77.0)
Platelets: 220 K/uL (ref 150.0–400.0)
RBC: 4.48 Mil/uL (ref 3.87–5.11)
RDW: 12.3 % (ref 11.5–15.5)
WBC: 4.5 K/uL (ref 4.0–10.5)

## 2024-01-17 NOTE — Progress Notes (Signed)
 "   Patient ID: Caroline Patterson, female    DOB: [birth year], 34 y.o.   MRN: [id number]  This visit was conducted in person.  BP 110/80   Pulse (!) 109   Temp 98.6 F (37 C) (Temporal)   Ht 5' 2 (1.575 m)   Wt 187 lb 2 oz (84.9 kg)   SpO2 98%   BMI 34.23 kg/m    CC:  Chief Complaint  Patient presents with   Bleeding during intercourse    Subjective:   HPI: Caroline Patterson is a 34 y.o. female presenting on 01/17/2024 for Bleeding during intercourse  Last PAP 01/27/2020 ASCUS negative HPV.  PAP was recommended to repeat in 3 years...[facility].. was normal then. 05/2020 Years ago had a colposcopy.   In last 3 months she has noted intermittent bleeding during and after intercourse  Spotting for a few days after.  No discomfort with intercourse.  Does not happen all the time.  No abdominal pain.   No dizziness, no fatigue.  No  other bleeding, has always bruised easy.  Not on blood thinner or NSAIDs   Her menses  irregular since starting Nuvaring.. has only had 2 menses in last 9 months.    Pt is using Nuvaring for  contraception  since April of w2025.  Relevant past medical, surgical, family and social history reviewed and updated as indicated. Interim medical history since our last visit reviewed. Allergies and medications reviewed and updated. Outpatient Medications Prior to Visit  Medication Sig Dispense Refill   etonogestrel-ethinyl estradiol (NUVARING) 0.12-0.015 MG/24HR vaginal ring Place 1 each vaginally every 28 (twenty-eight) days.     No facility-administered medications prior to visit.     Per HPI unless specifically indicated in ROS section below Review of Systems  Constitutional:  Negative for fatigue and fever.  HENT:  Negative for congestion.   Eyes:  Negative for pain.  Respiratory:  Negative for cough and shortness of breath.   Cardiovascular:  Negative for chest pain, palpitations and leg swelling.   Gastrointestinal:  Negative for abdominal pain.  Genitourinary:  Negative for dysuria and vaginal bleeding.  Musculoskeletal:  Negative for back pain.  Neurological:  Negative for syncope, light-headedness and headaches.  Psychiatric/Behavioral:  Negative for dysphoric mood.    Objective:  BP 110/80   Pulse (!) 109   Temp 98.6 F (37 C) (Temporal)   Ht 5' 2 (1.575 m)   Wt 187 lb 2 oz (84.9 kg)   SpO2 98%   BMI 34.23 kg/m   Wt Readings from Last 3 Encounters:  01/17/24 187 lb 2 oz (84.9 kg)  07/17/23 181 lb 2 oz (82.2 kg)  07/04/22 181 lb 4 oz (82.2 kg)      Physical Exam Exam conducted with a chaperone present.  Constitutional:      General: She is not in acute distress.    Appearance: Normal appearance. She is well-developed. She is not ill-appearing or toxic-appearing.  HENT:     Head: Normocephalic.     Right Ear: Hearing, tympanic membrane, ear canal and external ear normal. Tympanic membrane is not erythematous, retracted or bulging.     Left Ear: Hearing, tympanic membrane, ear canal and external ear normal. Tympanic membrane is not erythematous, retracted or bulging.     Nose: Nose normal. No mucosal edema or rhinorrhea.     Right Sinus: No maxillary sinus tenderness or frontal sinus tenderness.     Left Sinus:  No maxillary sinus tenderness or frontal sinus tenderness.     Mouth/Throat:     Pharynx: Uvula midline.  Eyes:     General: Lids are normal. Lids are everted, no foreign bodies appreciated.     Conjunctiva/sclera: Conjunctivae normal.     Pupils: Pupils are equal, round, and reactive to light.  Neck:     Thyroid : No thyroid  mass or thyromegaly.     Vascular: No carotid bruit.     Trachea: Trachea normal.  Cardiovascular:     Rate and Rhythm: Normal rate and regular rhythm.     Pulses: Normal pulses.     Heart sounds: Normal heart sounds, S1 normal and S2 normal. No murmur heard.    No friction rub. No gallop.  Pulmonary:     Effort: Pulmonary effort  is normal. No tachypnea or respiratory distress.     Breath sounds: Normal breath sounds. No decreased breath sounds, wheezing, rhonchi or rales.  Abdominal:     General: Bowel sounds are normal. There is no distension or abdominal bruit.     Palpations: Abdomen is soft. There is no fluid wave or mass.     Tenderness: There is no abdominal tenderness. There is no guarding or rebound.     Hernia: No hernia is present.  Genitourinary:    Exam position: Supine.     Labia:        Right: No rash, tenderness or lesion.        Left: No rash, tenderness or lesion.      Urethra: No prolapse, urethral pain, urethral swelling or urethral lesion.     Vagina: No signs of injury. Erythema present. No vaginal discharge, tenderness, bleeding or prolapsed vaginal walls.     Cervix: Friability present. No cervical motion tenderness or discharge.     Uterus: Not enlarged and not tender.      Adnexa:        Right: No mass, tenderness or fullness.         Left: No mass, tenderness or fullness.       Comments: Small amount of friability in vaginal canal and cervix NuvaRing in place Musculoskeletal:     Cervical back: Normal range of motion and neck supple.  Lymphadenopathy:     Cervical: No cervical adenopathy.  Skin:    General: Skin is warm and dry.     Findings: No rash.  Neurological:     Mental Status: She is alert.     Cranial Nerves: No cranial nerve deficit.     Sensory: No sensory deficit.  Psychiatric:        Mood and Affect: Mood is not anxious or depressed.        Speech: Speech normal.        Behavior: Behavior normal. Behavior is cooperative.        Thought Content: Thought content normal.        Judgment: Judgment normal.       Results for orders placed or performed in visit on 01/17/24  POCT urine pregnancy   Collection Time: 01/17/24 11:51 AM  Result Value Ref Range   Preg Test, Ur Negative Negative  CBC with Differential/Platelet   Collection Time: 01/17/24 12:12 PM   Result Value Ref Range   WBC 4.5 4.0 - 10.5 K/uL   RBC 4.48 3.87 - 5.11 Mil/uL   Hemoglobin 14.3 12.0 - 15.0 g/dL   HCT 59.3 63.9 - 53.9 %   MCV 90.5 78.0 - 100.0  fl   MCHC 35.3 30.0 - 36.0 g/dL   RDW 87.6 88.4 - 84.4 %   Platelets 220.0 150.0 - 400.0 K/uL   Neutrophils Relative % 50.1 43.0 - 77.0 %   Lymphocytes Relative 37.6 12.0 - 46.0 %   Monocytes Relative 8.7 3.0 - 12.0 %   Eosinophils Relative 3.0 0.0 - 5.0 %   Basophils Relative 0.6 0.0 - 3.0 %   Neutro Abs 2.2 1.4 - 7.7 K/uL   Lymphs Abs 1.7 0.7 - 4.0 K/uL   Monocytes Absolute 0.4 0.1 - 1.0 K/uL   Eosinophils Absolute 0.1 0.0 - 0.7 K/uL   Basophils Absolute 0.0 0.0 - 0.1 K/uL    Assessment and Plan  Bleeding after intercourse Assessment & Plan: Acute but persistent over the last several months New start NuvaRing approximately 9 months ago.  Since then she has not had a menstrual cycle more than 2 times. Bleeding after intercourse likely secondary to NuvaRing irritation and/or irregular menses. Urine pregnancy test is negative. Given past irregular Pap smears reevaluated with Pap smear and STD testing today. Patient without additional bleeding or easy bruising, not on anticoagulant.  If persistent can consider changing birth control from NuvaRing to alternate method versus additional evaluation with ultrasound pelvis versus follow-up with GYN.  Orders: -     CBC with Differential/Platelet -     POCT urine pregnancy  Screening examination for STI -     HIV Antibody (routine testing w rflx) -     RPR W/RFLX TO RPR TITER, TREPONEMAL AB, SCREEN AND DIAGNOSIS -     Herpes Simplex Virus 1 and 2 (IgG), with Reflex to HSV-2 Inhibition -     Hepatitis panel, acute -     Cytology - PAP  Cervical cancer screening -     Cytology - PAP    No follow-ups on file.   Greig Ring, MD  "

## 2024-01-17 NOTE — Assessment & Plan Note (Signed)
 Acute but persistent over the last several months New start NuvaRing approximately 9 months ago.  Since then she has not had a menstrual cycle more than 2 times. Bleeding after intercourse likely secondary to NuvaRing irritation and/or irregular menses. Urine pregnancy test is negative. Given past irregular Pap smears reevaluated with Pap smear and STD testing today. Patient without additional bleeding or easy bruising, not on anticoagulant.  If persistent can consider changing birth control from NuvaRing to alternate method versus additional evaluation with ultrasound pelvis versus follow-up with GYN.

## 2024-01-18 ENCOUNTER — Ambulatory Visit: Payer: Self-pay | Admitting: Family Medicine

## 2024-01-18 LAB — HEPATITIS PANEL, ACUTE
Hep A IgM: NONREACTIVE
Hep B C IgM: NONREACTIVE
Hepatitis B Surface Ag: NONREACTIVE
Hepatitis C Ab: NONREACTIVE

## 2024-01-18 LAB — HIV ANTIBODY (ROUTINE TESTING W REFLEX)
HIV 1&2 Ab, 4th Generation: NONREACTIVE
HIV FINAL INTERPRETATION: NEGATIVE

## 2024-01-18 LAB — HERPES SIMPLEX VIRUS 1 AND 2 (IGG),REFLEX HSV-2 INHIBITION
HSV 1 IGG,TYPE SPECIFIC AB: 22 {index} — ABNORMAL HIGH
HSV 2 IGG,TYPE SPECIFIC AB: <0.9 {index}

## 2024-01-18 LAB — SYPHILIS: RPR W/REFLEX TO RPR TITER AND TREPONEMAL ANTIBODIES, TRADITIONAL SCREENING AND DIAGNOSIS ALGORITHM: RPR Ser Ql: NONREACTIVE

## 2024-01-23 ENCOUNTER — Telehealth: Payer: Self-pay

## 2024-01-23 LAB — CYTOLOGY - PAP
Chlamydia: NEGATIVE
Comment: NEGATIVE
Comment: NEGATIVE
Comment: NEGATIVE
Comment: NEGATIVE
Comment: NORMAL
Diagnosis: NEGATIVE
Diagnosis: REACTIVE
HPV 16: NEGATIVE
HPV 18 / 45: NEGATIVE
High risk HPV: POSITIVE — AB
Neisseria Gonorrhea: NEGATIVE

## 2024-01-23 NOTE — Telephone Encounter (Signed)
 I see where patient has reviewed lab results but don't think we had received pap results at that time.

## 2024-01-23 NOTE — Telephone Encounter (Signed)
 Sent message through Mychart

## 2024-01-23 NOTE — Telephone Encounter (Signed)
 Copied from CRM #[id number]. Topic: Clinical - Lab/Test Results >> Jan 23, 2024  9:27 AM Montie POUR wrote: Reason for CRM:  Harlo would like a nurse to call her about her PAP smear results. She has questions about the results. Please call her at [phone]

## 2024-02-07 ENCOUNTER — Telehealth: Admitting: Physician Assistant

## 2024-02-07 DIAGNOSIS — B9789 Other viral agents as the cause of diseases classified elsewhere: Secondary | ICD-10-CM | POA: Diagnosis not present

## 2024-02-07 DIAGNOSIS — J019 Acute sinusitis, unspecified: Secondary | ICD-10-CM

## 2024-02-07 MED ORDER — PROMETHAZINE-DM 6.25-15 MG/5ML PO SYRP: 5.0000 mL | ORAL_SOLUTION | Freq: Four times a day (QID) | ORAL | 0 refills | Status: AC | PRN

## 2024-02-07 MED ORDER — IPRATROPIUM BROMIDE 0.03 % NA SOLN: 2.0000 | Freq: Two times a day (BID) | NASAL | 0 refills | Status: AC

## 2024-02-07 MED ORDER — PREDNISONE 10 MG (21) PO TBPK: ORAL_TABLET | ORAL | 0 refills | Status: AC

## 2024-02-07 NOTE — Progress Notes (Signed)
 " Virtual Visit Consent   Caroline Patterson, you are scheduled for a virtual visit with a [facility] provider today. Just as with appointments in the office, your consent must be obtained to participate. Your consent will be active for this visit and any virtual visit you may have with one of our providers in the next 365 days. If you have a MyChart account, a copy of this consent can be sent to you electronically.  As this is a virtual visit, video technology does not allow for your provider to perform a traditional examination. This may limit your provider's ability to fully assess your condition. If your provider identifies any concerns that need to be evaluated in person or the need to arrange testing (such as labs, EKG, etc.), we will make arrangements to do so. Although advances in technology are sophisticated, we cannot ensure that it will always work on either your end or our end. If the connection with a video visit is poor, the visit may have to be switched to a telephone visit. With either a video or telephone visit, we are not always able to ensure that we have a secure connection.  By engaging in this virtual visit, you consent to the provision of healthcare and authorize for your insurance to be billed (if applicable) for the services provided during this visit. Depending on your insurance coverage, you may receive a charge related to this service.  I need to obtain your verbal consent now. Are you willing to proceed with your visit today? Caroline Patterson has provided verbal consent on 02/07/2024 for a virtual visit (video or telephone). Elsie Velma Lunger, [place]  Date: 02/07/2024 6:20 PM   Virtual Visit via Video Note   I, Elsie Velma Lunger, connected with  Taren Dymek  ([id number], 1990/08/28) on 02/07/24 at  6:15 PM EST by a video-enabled telemedicine application and verified that I am speaking with the correct person using two identifiers.  Location: Patient: Virtual Visit  Location Patient: Home Provider: Virtual Visit Location Provider: Home Office   I discussed the limitations of evaluation and management by telemedicine and the availability of in person appointments. The patient expressed understanding and agreed to proceed.    History of Present Illness: Caroline Patterson is a 34 y.o. who identifies as a female who was assigned female at birth, and is being seen today for about 2.5 days of nasal congestion, sore throat followed by cough, chills (no fever), and fatigue. Notes substantial sinus fullness today. Denies recent travel. Daughters are starting to get sick now.   OTC -- Robitussin, Sudafed, Dayquil.  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  Resting comfortably at home.  Head is normocephalic, atraumatic.  No labored breathing. Speech is clear and coherent with logical content.  Patient is alert and oriented at baseline.   Assessment and Plan: 1. Acute viral sinusitis (Primary) - promethazine -dextromethorphan (PROMETHAZINE -DM) 6.25-15 MG/5ML syrup; Take 5 mLs by mouth 4 (four) times daily as needed for cough.  Dispense: 118 mL; Refill: 0 - ipratropium (ATROVENT ) 0.03 % nasal spray;  Place 2 sprays into both nostrils every 12 (twelve) hours.  Dispense: 30 mL; Refill: 0 - predniSONE  (STERAPRED UNI-PAK 21 TAB) 10 MG (21) TBPK tablet; Take following package directions  Dispense: 21 tablet; Refill: 0  Denies concern for pregnancy. Is not breastfeeding. Rx  Promethazine -DM syrup and Atrovent  nasal spray.  Increase fluids.  Rest.  Saline nasal spray.  Probiotic.  Mucinex as directed.  Humidifier in bedroom. If continued sinus pressure or pain, will have her add on steroid pack.  Call or return to clinic if symptoms are not improving.   Follow Up Instructions: I discussed the assessment and treatment plan with the patient. The patient was provided an opportunity to ask questions and all were answered. The patient agreed with the plan and demonstrated an understanding of the instructions.  A copy of instructions were sent to the patient via MyChart unless otherwise noted below.   The patient was advised to call back or seek an in-person evaluation if the symptoms worsen or if the condition fails to improve as anticipated.    Elsie Velma Lunger, PA-C    [1] No Known Allergies [2]  Current Outpatient Medications:    ipratropium (ATROVENT ) 0.03 % nasal spray, Place 2 sprays into both nostrils every 12 (twelve) hours., Disp: 30 mL, Rfl: 0   predniSONE  (STERAPRED UNI-PAK 21 TAB) 10 MG (21) TBPK tablet, Take following package directions, Disp: 21 tablet, Rfl: 0   promethazine -dextromethorphan (PROMETHAZINE -DM) 6.25-15 MG/5ML syrup, Take 5 mLs by mouth 4 (four) times daily as needed for cough., Disp: 118 mL, Rfl: 0   etonogestrel-ethinyl estradiol (NUVARING) 0.12-0.015 MG/24HR vaginal ring, Place 1 each vaginally every 28 (twenty-eight) days., Disp: , Rfl:   "

## 2024-02-07 NOTE — Patient Instructions (Signed)
 " Alan Earnie Richmond, thank you for joining Elsie Velma Lunger, PA-C for today's virtual visit.  While this provider is not your primary care provider (PCP), if your PCP is located in our provider database this encounter information will be shared with them immediately following your visit.   A [facility] MyChart account gives you access to today's visit and all your visits, tests, and labs performed at [facility]  click here if you don't have a [facility] MyChart account or go to [email or web address]  Consent: (Patient) Chrystine Frogge provided verbal consent for this virtual visit at the beginning of the encounter.  Current Medications:  Current Outpatient Medications:    etonogestrel-ethinyl estradiol (NUVARING) 0.12-0.015 MG/24HR vaginal ring, Place 1 each vaginally every 28 (twenty-eight) days., Disp: , Rfl:    Medications ordered in this encounter:  No orders of the defined types were placed in this encounter.    *If you need refills on other medications prior to your next appointment, please contact your pharmacy*  Follow-Up: Call back or seek an in-person evaluation if the symptoms worsen or if the condition fails to improve as anticipated.     Other Instructions We are sorry that you are not feeling well.  Here is how we plan to help!  Based on what you have shared with me it looks like you have sinusitis.  Sinusitis is inflammation and infection in the sinus cavities of the head.  Based on your presentation I believe you most likely have Acute Viral Sinusitis.This is an infection most likely caused by a virus. There is not specific treatment for viral sinusitis other than to help you with the symptoms until the infection runs its course.  You may use an oral decongestant such as Mucinex D or if you have glaucoma or high blood pressure use plain Mucinex. Saline nasal spray help and can safely be used as often as needed for congestion, I have prescribed:  Ipratropium Bromide  nasal spray 0.03% 2 sprays in eah nostril 2-3 times a day. I have also sent in a prescription cough syrup for you to use as directed.  Some authorities believe that zinc sprays or the use of Echinacea may shorten the course of your symptoms.  Sinus infections are not as easily transmitted as other respiratory infection, however we still recommend that you avoid close contact with loved ones, especially the very young and elderly.  Remember to wash your hands thoroughly throughout the day as this is the number one way to prevent the spread of infection!  Home Care: Only take medications as instructed by your medical team. Do not take these medications with alcohol. A steam or ultrasonic humidifier can help congestion.  You can place a towel over your head and breathe in the steam from hot water coming from a faucet. Avoid close contacts especially the very young and the elderly. Cover your mouth when you cough or sneeze. Always remember to wash your hands.  Get Help Right Away If: You develop worsening fever or sinus pain. You develop a severe head ache or visual changes. Your symptoms persist after you have completed your treatment plan.  Make sure you Understand these instructions. Will watch your condition. Will get help right away if you are not doing well or get worse.  Elsie Velma Lunger, PA-C      If you have been instructed to have an in-person evaluation today at a local Urgent Care facility, please use the link below. It will take you  to a list of all of our available [facility], including address, phone number and hours of operation. Please do not delay care.  [facility]  If you or a family member do not have a primary care provider, use the link below to schedule a visit and establish care. When you choose a [facility] primary care physician or advanced practice provider, you gain a long-term partner in health. Find a  Primary Care Provider  Learn more about [facility]'s in-office and virtual care options: [facility] - Get Care Now  "
